# Patient Record
Sex: Female | Born: 1970 | Race: White | Hispanic: No | Smoking: Current every day smoker
Health system: Southern US, Community
[De-identification: ages and names within clinical notes are randomized; demographics above are authoritative.]

## PROBLEM LIST (undated history)

## (undated) DIAGNOSIS — M3390 Dermatopolymyositis, unspecified, organ involvement unspecified: Secondary | ICD-10-CM

## (undated) DIAGNOSIS — F329 Major depressive disorder, single episode, unspecified: Secondary | ICD-10-CM

## (undated) DIAGNOSIS — I10 Essential (primary) hypertension: Secondary | ICD-10-CM

## (undated) DIAGNOSIS — K219 Gastro-esophageal reflux disease without esophagitis: Secondary | ICD-10-CM

## (undated) DIAGNOSIS — R946 Abnormal results of thyroid function studies: Secondary | ICD-10-CM

## (undated) DIAGNOSIS — IMO0001 Reserved for inherently not codable concepts without codable children: Secondary | ICD-10-CM

## (undated) DIAGNOSIS — J9383 Other pneumothorax: Secondary | ICD-10-CM

## (undated) DIAGNOSIS — M797 Fibromyalgia: Secondary | ICD-10-CM

## (undated) DIAGNOSIS — F411 Generalized anxiety disorder: Secondary | ICD-10-CM

## (undated) DIAGNOSIS — F17200 Nicotine dependence, unspecified, uncomplicated: Secondary | ICD-10-CM

## (undated) DIAGNOSIS — R7309 Other abnormal glucose: Secondary | ICD-10-CM

## (undated) DIAGNOSIS — J45909 Unspecified asthma, uncomplicated: Secondary | ICD-10-CM

## (undated) DIAGNOSIS — M3313 Other dermatomyositis without myopathy: Secondary | ICD-10-CM

## (undated) DIAGNOSIS — J329 Chronic sinusitis, unspecified: Secondary | ICD-10-CM

## (undated) DIAGNOSIS — J45901 Unspecified asthma with (acute) exacerbation: Secondary | ICD-10-CM

## (undated) DIAGNOSIS — J309 Allergic rhinitis, unspecified: Secondary | ICD-10-CM

## (undated) HISTORY — DX: Nicotine dependence, unspecified, uncomplicated: F17.200

## (undated) HISTORY — PX: COLONOSCOPY: SHX174

## (undated) HISTORY — DX: Unspecified asthma, uncomplicated: J45.909

## (undated) HISTORY — DX: Abnormal results of thyroid function studies: R94.6

## (undated) HISTORY — DX: Reserved for inherently not codable concepts without codable children: IMO0001

## (undated) HISTORY — DX: Other abnormal glucose: R73.09

## (undated) HISTORY — DX: Major depressive disorder, single episode, unspecified: F32.9

## (undated) HISTORY — DX: Allergic rhinitis, unspecified: J30.9

## (undated) HISTORY — DX: Generalized anxiety disorder: F41.1

## (undated) HISTORY — DX: Unspecified asthma with (acute) exacerbation: J45.901

## (undated) HISTORY — PX: LUNG SURGERY: SHX703

## (undated) HISTORY — DX: Essential (primary) hypertension: I10

## (undated) HISTORY — PX: CERVICAL CONIZATION W/BX: SHX1330

## (undated) HISTORY — PX: KNEE SURGERY: SHX244

## (undated) HISTORY — DX: Gastro-esophageal reflux disease without esophagitis: K21.9

---

## 1997-08-02 ENCOUNTER — Other Ambulatory Visit: Admission: RE | Admit: 1997-08-02 | Discharge: 1997-08-02 | Payer: Self-pay | Admitting: *Deleted

## 1997-11-21 ENCOUNTER — Other Ambulatory Visit: Admission: RE | Admit: 1997-11-21 | Discharge: 1997-11-21 | Payer: Self-pay | Admitting: *Deleted

## 1997-12-24 ENCOUNTER — Other Ambulatory Visit: Admission: RE | Admit: 1997-12-24 | Discharge: 1997-12-24 | Payer: Self-pay | Admitting: *Deleted

## 1998-02-12 ENCOUNTER — Other Ambulatory Visit: Admission: RE | Admit: 1998-02-12 | Discharge: 1998-02-12 | Payer: Self-pay | Admitting: *Deleted

## 1998-12-20 ENCOUNTER — Emergency Department (HOSPITAL_COMMUNITY): Admission: EM | Admit: 1998-12-20 | Discharge: 1998-12-20 | Payer: Self-pay | Admitting: Emergency Medicine

## 1998-12-23 ENCOUNTER — Emergency Department (HOSPITAL_COMMUNITY): Admission: EM | Admit: 1998-12-23 | Discharge: 1998-12-23 | Payer: Self-pay | Admitting: Emergency Medicine

## 1999-04-21 ENCOUNTER — Emergency Department (HOSPITAL_COMMUNITY): Admission: EM | Admit: 1999-04-21 | Discharge: 1999-04-21 | Payer: Self-pay | Admitting: Internal Medicine

## 1999-04-30 ENCOUNTER — Emergency Department (HOSPITAL_COMMUNITY): Admission: EM | Admit: 1999-04-30 | Discharge: 1999-04-30 | Payer: Self-pay | Admitting: Emergency Medicine

## 1999-07-09 ENCOUNTER — Other Ambulatory Visit: Admission: RE | Admit: 1999-07-09 | Discharge: 1999-07-09 | Payer: Self-pay | Admitting: *Deleted

## 1999-12-02 ENCOUNTER — Inpatient Hospital Stay (HOSPITAL_COMMUNITY): Admission: AD | Admit: 1999-12-02 | Discharge: 1999-12-02 | Payer: Self-pay | Admitting: Obstetrics

## 1999-12-03 ENCOUNTER — Inpatient Hospital Stay (HOSPITAL_COMMUNITY): Admission: AD | Admit: 1999-12-03 | Discharge: 1999-12-03 | Payer: Self-pay | Admitting: Obstetrics & Gynecology

## 1999-12-03 ENCOUNTER — Encounter (INDEPENDENT_AMBULATORY_CARE_PROVIDER_SITE_OTHER): Payer: Self-pay

## 1999-12-04 ENCOUNTER — Encounter: Payer: Self-pay | Admitting: *Deleted

## 1999-12-04 ENCOUNTER — Observation Stay (HOSPITAL_COMMUNITY): Admission: AD | Admit: 1999-12-04 | Discharge: 1999-12-05 | Payer: Self-pay | Admitting: *Deleted

## 1999-12-05 ENCOUNTER — Inpatient Hospital Stay (HOSPITAL_COMMUNITY): Admission: AD | Admit: 1999-12-05 | Discharge: 1999-12-05 | Payer: Self-pay | Admitting: *Deleted

## 1999-12-08 ENCOUNTER — Inpatient Hospital Stay (HOSPITAL_COMMUNITY): Admission: AD | Admit: 1999-12-08 | Discharge: 1999-12-08 | Payer: Self-pay | Admitting: *Deleted

## 1999-12-11 ENCOUNTER — Inpatient Hospital Stay (HOSPITAL_COMMUNITY): Admission: AD | Admit: 1999-12-11 | Discharge: 1999-12-11 | Payer: Self-pay | Admitting: Obstetrics

## 1999-12-18 ENCOUNTER — Inpatient Hospital Stay (HOSPITAL_COMMUNITY): Admission: AD | Admit: 1999-12-18 | Discharge: 1999-12-18 | Payer: Self-pay | Admitting: *Deleted

## 1999-12-21 ENCOUNTER — Encounter: Payer: Self-pay | Admitting: Obstetrics & Gynecology

## 1999-12-21 ENCOUNTER — Inpatient Hospital Stay (HOSPITAL_COMMUNITY): Admission: AD | Admit: 1999-12-21 | Discharge: 1999-12-21 | Payer: Self-pay | Admitting: Obstetrics

## 1999-12-25 ENCOUNTER — Inpatient Hospital Stay (HOSPITAL_COMMUNITY): Admission: AD | Admit: 1999-12-25 | Discharge: 1999-12-25 | Payer: Self-pay | Admitting: *Deleted

## 2000-01-01 ENCOUNTER — Inpatient Hospital Stay (HOSPITAL_COMMUNITY): Admission: AD | Admit: 2000-01-01 | Discharge: 2000-01-01 | Payer: Self-pay | Admitting: *Deleted

## 2000-01-13 ENCOUNTER — Inpatient Hospital Stay (HOSPITAL_COMMUNITY): Admission: AD | Admit: 2000-01-13 | Discharge: 2000-01-13 | Payer: Self-pay | Admitting: Obstetrics

## 2000-08-15 ENCOUNTER — Other Ambulatory Visit: Admission: RE | Admit: 2000-08-15 | Discharge: 2000-08-15 | Payer: Self-pay | Admitting: Family Medicine

## 2000-08-17 ENCOUNTER — Encounter: Payer: Self-pay | Admitting: Family Medicine

## 2000-08-17 ENCOUNTER — Encounter: Admission: RE | Admit: 2000-08-17 | Discharge: 2000-08-17 | Payer: Self-pay | Admitting: Family Medicine

## 2001-06-23 ENCOUNTER — Inpatient Hospital Stay (HOSPITAL_COMMUNITY): Admission: EM | Admit: 2001-06-23 | Discharge: 2001-06-27 | Payer: Self-pay | Admitting: *Deleted

## 2001-06-24 ENCOUNTER — Encounter: Payer: Self-pay | Admitting: Thoracic Surgery (Cardiothoracic Vascular Surgery)

## 2001-06-24 ENCOUNTER — Encounter: Payer: Self-pay | Admitting: Internal Medicine

## 2001-06-25 ENCOUNTER — Encounter: Payer: Self-pay | Admitting: Thoracic Surgery (Cardiothoracic Vascular Surgery)

## 2001-06-26 ENCOUNTER — Encounter: Payer: Self-pay | Admitting: Cardiothoracic Surgery

## 2001-06-26 ENCOUNTER — Encounter: Payer: Self-pay | Admitting: Thoracic Surgery (Cardiothoracic Vascular Surgery)

## 2001-06-27 ENCOUNTER — Encounter: Payer: Self-pay | Admitting: Thoracic Surgery (Cardiothoracic Vascular Surgery)

## 2001-07-03 ENCOUNTER — Encounter
Admission: RE | Admit: 2001-07-03 | Discharge: 2001-07-03 | Payer: Self-pay | Admitting: Thoracic Surgery (Cardiothoracic Vascular Surgery)

## 2001-07-03 ENCOUNTER — Encounter: Payer: Self-pay | Admitting: Thoracic Surgery (Cardiothoracic Vascular Surgery)

## 2001-08-10 ENCOUNTER — Encounter: Payer: Self-pay | Admitting: Thoracic Surgery (Cardiothoracic Vascular Surgery)

## 2001-08-10 ENCOUNTER — Encounter: Payer: Self-pay | Admitting: Emergency Medicine

## 2001-08-10 ENCOUNTER — Inpatient Hospital Stay (HOSPITAL_COMMUNITY)
Admission: EM | Admit: 2001-08-10 | Discharge: 2001-08-14 | Payer: Self-pay | Admitting: Thoracic Surgery (Cardiothoracic Vascular Surgery)

## 2001-08-10 ENCOUNTER — Encounter (INDEPENDENT_AMBULATORY_CARE_PROVIDER_SITE_OTHER): Payer: Self-pay | Admitting: *Deleted

## 2001-08-11 ENCOUNTER — Encounter: Payer: Self-pay | Admitting: Thoracic Surgery (Cardiothoracic Vascular Surgery)

## 2001-08-12 ENCOUNTER — Encounter: Payer: Self-pay | Admitting: General Surgery

## 2001-08-13 ENCOUNTER — Encounter: Payer: Self-pay | Admitting: Thoracic Surgery (Cardiothoracic Vascular Surgery)

## 2001-08-14 ENCOUNTER — Encounter: Payer: Self-pay | Admitting: Thoracic Surgery (Cardiothoracic Vascular Surgery)

## 2001-09-11 ENCOUNTER — Encounter
Admission: RE | Admit: 2001-09-11 | Discharge: 2001-09-11 | Payer: Self-pay | Admitting: Thoracic Surgery (Cardiothoracic Vascular Surgery)

## 2001-09-11 ENCOUNTER — Encounter: Payer: Self-pay | Admitting: Thoracic Surgery (Cardiothoracic Vascular Surgery)

## 2003-03-30 HISTORY — PX: OTHER SURGICAL HISTORY: SHX169

## 2003-04-30 ENCOUNTER — Other Ambulatory Visit: Admission: RE | Admit: 2003-04-30 | Discharge: 2003-04-30 | Payer: Self-pay | Admitting: Family Medicine

## 2003-05-30 ENCOUNTER — Other Ambulatory Visit (HOSPITAL_COMMUNITY): Admission: RE | Admit: 2003-05-30 | Discharge: 2003-05-30 | Payer: Self-pay | Admitting: Psychiatry

## 2004-06-17 ENCOUNTER — Ambulatory Visit: Payer: Self-pay | Admitting: Family Medicine

## 2004-11-19 ENCOUNTER — Other Ambulatory Visit: Admission: RE | Admit: 2004-11-19 | Discharge: 2004-11-19 | Payer: Self-pay | Admitting: Family Medicine

## 2004-11-19 ENCOUNTER — Ambulatory Visit: Payer: Self-pay | Admitting: Family Medicine

## 2004-12-10 ENCOUNTER — Encounter: Admission: RE | Admit: 2004-12-10 | Discharge: 2004-12-10 | Payer: Self-pay | Admitting: Family Medicine

## 2005-08-17 ENCOUNTER — Ambulatory Visit: Payer: Self-pay | Admitting: Family Medicine

## 2005-12-03 ENCOUNTER — Ambulatory Visit: Payer: Self-pay | Admitting: Family Medicine

## 2006-02-10 ENCOUNTER — Ambulatory Visit: Payer: Self-pay | Admitting: Family Medicine

## 2006-08-30 ENCOUNTER — Encounter: Payer: Self-pay | Admitting: *Deleted

## 2006-08-31 ENCOUNTER — Observation Stay (HOSPITAL_COMMUNITY): Admission: AD | Admit: 2006-08-31 | Discharge: 2006-08-31 | Payer: Self-pay | Admitting: Obstetrics & Gynecology

## 2006-08-31 ENCOUNTER — Encounter (INDEPENDENT_AMBULATORY_CARE_PROVIDER_SITE_OTHER): Payer: Self-pay | Admitting: Obstetrics & Gynecology

## 2007-01-23 ENCOUNTER — Telehealth: Payer: Self-pay | Admitting: Family Medicine

## 2007-06-26 ENCOUNTER — Ambulatory Visit: Payer: Self-pay | Admitting: Family Medicine

## 2007-06-26 DIAGNOSIS — J309 Allergic rhinitis, unspecified: Secondary | ICD-10-CM | POA: Insufficient documentation

## 2007-06-26 DIAGNOSIS — J45909 Unspecified asthma, uncomplicated: Secondary | ICD-10-CM

## 2007-06-26 DIAGNOSIS — F17200 Nicotine dependence, unspecified, uncomplicated: Secondary | ICD-10-CM

## 2007-06-26 HISTORY — DX: Nicotine dependence, unspecified, uncomplicated: F17.200

## 2007-06-26 HISTORY — DX: Unspecified asthma, uncomplicated: J45.909

## 2007-06-26 HISTORY — DX: Allergic rhinitis, unspecified: J30.9

## 2007-07-26 ENCOUNTER — Ambulatory Visit: Payer: Self-pay | Admitting: Internal Medicine

## 2007-07-26 DIAGNOSIS — J45901 Unspecified asthma with (acute) exacerbation: Secondary | ICD-10-CM

## 2007-07-26 HISTORY — DX: Unspecified asthma with (acute) exacerbation: J45.901

## 2007-07-31 ENCOUNTER — Ambulatory Visit: Payer: Self-pay | Admitting: Family Medicine

## 2007-07-31 LAB — CONVERTED CEMR LAB
Bilirubin Urine: NEGATIVE
Glucose, Urine, Semiquant: NEGATIVE
Specific Gravity, Urine: 1.02
Urobilinogen, UA: 0.2
pH: 6

## 2007-08-01 LAB — CONVERTED CEMR LAB
Albumin: 4.3 g/dL (ref 3.5–5.2)
BUN: 11 mg/dL (ref 6–23)
Calcium: 9.6 mg/dL (ref 8.4–10.5)
Eosinophils Relative: 0.1 % (ref 0.0–5.0)
GFR calc Af Amer: 144 mL/min
Glucose, Bld: 114 mg/dL — ABNORMAL HIGH (ref 70–99)
HCT: 40.2 % (ref 36.0–46.0)
Hemoglobin: 13.7 g/dL (ref 12.0–15.0)
MCV: 100.3 fL — ABNORMAL HIGH (ref 78.0–100.0)
Monocytes Absolute: 0.1 10*3/uL (ref 0.1–1.0)
Monocytes Relative: 1.6 % — ABNORMAL LOW (ref 3.0–12.0)
Neutro Abs: 6.2 10*3/uL (ref 1.4–7.7)
RDW: 11.7 % (ref 11.5–14.6)
TSH: 0.25 microintl units/mL — ABNORMAL LOW (ref 0.35–5.50)
Total CHOL/HDL Ratio: 3.5
Total Protein: 7.5 g/dL (ref 6.0–8.3)
Triglycerides: 87 mg/dL (ref 0–149)
WBC: 7.5 10*3/uL (ref 4.5–10.5)

## 2007-08-11 ENCOUNTER — Telehealth: Payer: Self-pay | Admitting: Family Medicine

## 2007-08-22 ENCOUNTER — Ambulatory Visit: Payer: Self-pay | Admitting: Family Medicine

## 2007-08-22 DIAGNOSIS — R7309 Other abnormal glucose: Secondary | ICD-10-CM

## 2007-08-22 DIAGNOSIS — F3289 Other specified depressive episodes: Secondary | ICD-10-CM

## 2007-08-22 DIAGNOSIS — F329 Major depressive disorder, single episode, unspecified: Secondary | ICD-10-CM

## 2007-08-22 DIAGNOSIS — F411 Generalized anxiety disorder: Secondary | ICD-10-CM

## 2007-08-22 DIAGNOSIS — R946 Abnormal results of thyroid function studies: Secondary | ICD-10-CM

## 2007-08-22 HISTORY — DX: Major depressive disorder, single episode, unspecified: F32.9

## 2007-08-22 HISTORY — DX: Other specified depressive episodes: F32.89

## 2007-08-22 HISTORY — DX: Generalized anxiety disorder: F41.1

## 2007-08-22 HISTORY — DX: Other abnormal glucose: R73.09

## 2007-08-22 HISTORY — DX: Abnormal results of thyroid function studies: R94.6

## 2007-08-23 LAB — CONVERTED CEMR LAB
Free T4: 0.9 ng/dL (ref 0.6–1.6)
Hgb A1c MFr Bld: 5.5 % (ref 4.6–6.0)

## 2007-09-20 ENCOUNTER — Ambulatory Visit: Payer: Self-pay | Admitting: Family Medicine

## 2007-09-20 DIAGNOSIS — K5289 Other specified noninfective gastroenteritis and colitis: Secondary | ICD-10-CM

## 2007-12-20 ENCOUNTER — Telehealth: Payer: Self-pay | Admitting: Family Medicine

## 2008-07-02 ENCOUNTER — Telehealth: Payer: Self-pay | Admitting: Family Medicine

## 2009-04-25 ENCOUNTER — Telehealth: Payer: Self-pay | Admitting: Family Medicine

## 2009-12-17 ENCOUNTER — Emergency Department (HOSPITAL_COMMUNITY): Admission: EM | Admit: 2009-12-17 | Discharge: 2009-12-17 | Payer: Self-pay | Admitting: Emergency Medicine

## 2009-12-30 ENCOUNTER — Telehealth: Payer: Self-pay | Admitting: Family Medicine

## 2010-04-02 ENCOUNTER — Encounter: Payer: Self-pay | Admitting: Family Medicine

## 2010-04-15 ENCOUNTER — Encounter: Payer: Self-pay | Admitting: Family Medicine

## 2010-04-15 ENCOUNTER — Ambulatory Visit
Admission: RE | Admit: 2010-04-15 | Discharge: 2010-04-15 | Payer: Self-pay | Source: Home / Self Care | Attending: Family Medicine | Admitting: Family Medicine

## 2010-04-15 DIAGNOSIS — IMO0001 Reserved for inherently not codable concepts without codable children: Secondary | ICD-10-CM

## 2010-04-15 DIAGNOSIS — F419 Anxiety disorder, unspecified: Secondary | ICD-10-CM

## 2010-04-15 DIAGNOSIS — M791 Myalgia, unspecified site: Secondary | ICD-10-CM

## 2010-04-15 HISTORY — DX: Reserved for inherently not codable concepts without codable children: IMO0001

## 2010-04-15 NOTE — Progress Notes (Unsigned)
This is a 40 yr old female to re-establish with [place] after an absence of several years. She had been living in [place] until she lost her job. Now she is back in the [place] area. Her main complaints,  which started about 2 months ago, include generalized fatigue and  muscle aches including the shoulders and hips and knees and hands and feet. Not much joint swelling. No rashes or fevers or vision changes. She was seen at the [facility] on 04-02-10 and had a lot of labs drawn. These included normal CBC, metabolic panel, UA, TSH, ESR, and CRP. ANA was "positive" but no titer was reported. Negative for Parvovirus and Lyme titers. She was put on Meloxicam 15 mg a day, and this has helped a little. She also needs some routine med refills. Her anxiety is stable, as is her asthma.  ROS negative other than the above. EXAM HEENT clear, neck supple with no masses or tenderness, lungs clear, cardiac RRR without GMR, skin is clear. Extremities show no edema or synovial swelling. No tenderness.

## 2010-04-15 NOTE — Progress Notes (Deleted)
Subjective:     Patient ID: [name] [name] is a 40 y.o. female.  HPI  {Common ambulatory SmartLinks:19316}  Review of Systems     Objective:   Physical Exam     Assessment:     ***    Plan:     ***

## 2010-04-15 NOTE — Progress Notes (Unsigned)
Subjective:     Patient ID: [name] [name] is a 39 y.o. female.  HPI  {Common ambulatory SmartLinks:19316}  Review of Systems     Objective:   Physical Exam     Assessment:     ***    Plan:     ***      

## 2010-04-20 ENCOUNTER — Encounter: Payer: Self-pay | Admitting: Family Medicine

## 2010-04-20 ENCOUNTER — Telehealth: Payer: Self-pay | Admitting: Family Medicine

## 2010-04-23 ENCOUNTER — Encounter: Payer: Self-pay | Admitting: Family Medicine

## 2010-04-24 ENCOUNTER — Telehealth: Payer: Self-pay | Admitting: Family Medicine

## 2010-04-28 NOTE — Progress Notes (Signed)
Summary: Pt wants to know date and location of last mammogram  Phone Note Call from Patient Call back at [phone]   Caller: Patient Summary of Call: Pt called and needs to know when her last mammogram was and where?    Initial call taken by: Lucy Antigua,  April 25, 2009 8:56 AM  Follow-up for Phone Call        pt [facility].  Called and l/m on pts cell phone with the date and phone number to call and schedule appt Follow-up by: Alfred Levins, CMA,  April 25, 2009 1:28 PM

## 2010-04-28 NOTE — Progress Notes (Signed)
Summary: Pt moved back to town. Req script for Allbuterol  Phone Note Call from Patient Call back at [phone]   Caller: Patient Summary of Call: Pt called and says that she has moved  back to town. Pts insurance will not be effective until Jan 2012. Pt would like to re-est with Dr. Clent Ridges, but is wondering if he could write script for Albuterol Inhaler 8.5 grams 200 meter Inhalations, to last until she can sch an appt in Jan 2012. [facility] [place] [place]. Pt does not have telephone # for pharmacy. Pt has appt to re-est with Dr. Clent Ridges on 04/15/2010. Pt wants to be notified if this has been called in or not.  Initial call taken by: Lucy Antigua,  December 30, 2009 11:58 AM  Follow-up for Phone Call        call in Proair inhaler, #1 with 2 rf Follow-up by: Nelwyn Salisbury MD,  December 30, 2009 5:01 PM  Additional Follow-up for Phone Call Additional follow up Details #1::        left mess on pt phone rx called in  Additional Follow-up by: Pura Spice, RN,  December 30, 2009 5:14 PM    New/Updated Medications: PROAIR HFA 108 (90 BASE) MCG/ACT  AERS (ALBUTEROL SULFATE) 2 puffs every 4 hours as needed for sob Prescriptions: PROAIR HFA 108 (90 BASE) MCG/ACT  AERS (ALBUTEROL SULFATE) 2 puffs every 4 hours as needed for sob  #1 x 2   Entered by:   Pura Spice, RN   Authorized by:   Nelwyn Salisbury MD   Signed by:   Pura Spice, RN on 12/30/2009   Method used:   [facility] ...       [facility]  [address]  [id number]ail[facility]       [address]       [place], [place]  [address]       Ph: [phone] or [phone]       Fax: [phone]   RxID:   [id number]

## 2010-04-30 NOTE — Progress Notes (Addendum)
Summary: STATUS OF FAXED PAPERWORK  Phone Note Other Incoming   Caller: [employer] Summary of Call: Called to ck on status of faxed paperwork.  Initial call taken by: Debbra Riding,  April 24, 2010 4:31 PM  Follow-up for Phone Call        being sent to medical records for records they are requesting  Follow-up by: Pura Spice, RN,  April 24, 2010 5:17 PM

## 2010-04-30 NOTE — Progress Notes (Signed)
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 Fax to  [phone]   HR Claudette Head Pt. [phone] Initial call taken by: Lynann Beaver CMA AAMA,  April 20, 2010 8:50 AM  Follow-up for Phone Call        please take care of this. The note should cover her until 04-29-10 when she sees her rheumatologist  Follow-up by: Nelwyn Salisbury MD,  April 20, 2010 1:05 PM  Additional Follow-up for Phone Call Additional follow up Details #1::        faxed  pt aware Additional Follow-up by: Pura Spice, RN,  April 20, 2010 1:21 PM     Appended Document: note faxed to employer [phone] is the correct number, and the one more fax to [employer] [phone] [facility] for the first fax. Please fax to both numbers.  Appended Document: note faxed to employer spoke with pt whom said fax number yest as listed incorrect refaxed to new numbers she provided.

## 2010-04-30 NOTE — Letter (Signed)
Summary: [employer]  Work Excuse Letter   Imported By: Maryln Gottron 04/24/2010 15:31:35  _____________________________________________________________________  External Attachment:    Type:   Image     Comment:   External Document

## 2010-04-30 NOTE — Letter (Signed)
Summary: Release of Information  Release of Information   Imported By: Georgian Co 04/23/2010 10:38:34  _____________________________________________________________________  External Attachment:    Type:   Image     Comment:   External Document

## 2010-04-30 NOTE — Assessment & Plan Note (Signed)
Summary: pt to re-est/cjr   Vital Signs:  Patient profile:   40 year old female Weight:      157 pounds O2 Sat:      98 % Temp:     98.6 degrees F Pulse rate:   102 / minute BP sitting:   120 / 80  (left arm) Cuff size:   regular  Vitals Entered By: Pura Spice, RN (April 15, 2010 3:01 PM) CC: to re est  stataes had labs [facility]  did labs  had + ANA   Preventive Screening-Counseling & Management  Alcohol-Tobacco     Smoking Status: current     Packs/Day: 1.0  Allergies: 1)  ! Codeine  Social History: Packs/Day:  1.0   Impression & Recommendations:  Problem # 1:  MYALGIA (ICD-729.1)  Orders: Rheumatology Referral (Rheumatology)  Problem # 2:  ANXIETY (ICD-300.00)  Her updated medication list for this problem includes:    Lorazepam 0.5 Mg Tabs (Lorazepam) .Marland Kitchen..Marland Kitchen Two times a day as needed anxiety  Problem # 3:  ASTHMA (ICD-493.90)  The following medications were removed from the medication list:    Symbicort 160-4.5 Mcg/act Aero (Budesonide-formoterol fumarate) .Marland Kitchen... 2 puffs two times a day Her updated medication list for this problem includes:    Proair Hfa 108 (90 Base) Mcg/act Aers (Albuterol sulfate) .Marland Kitchen... 2 puffs every 4 hours as needed for sob    Prednisone 20 Mg Tabs (Prednisone) ..... Once daily  Complete Medication List: 1)  Lorazepam 0.5 Mg Tabs (Lorazepam) .... Two times a day as needed anxiety 2)  Proair Hfa 108 (90 Base) Mcg/act Aers (Albuterol sulfate) .... 2 puffs every 4 hours as needed for sob 3)  Prednisone 20 Mg Tabs (Prednisone) .... Once daily Prescriptions: PREDNISONE 20 MG TABS (PREDNISONE) once daily  #30 x 2   Entered and Authorized by:   Nelwyn Salisbury MD   Signed by:   Nelwyn Salisbury MD on 04/15/2010   Method used:   Print then Give to Patient   RxID:   [id number] PROAIR HFA 108 (90 BASE) MCG/ACT  AERS (ALBUTEROL SULFATE) 2 puffs every 4 hours as needed for sob  #1 x 5   Entered and Authorized by:   Nelwyn Salisbury MD  Signed by:   Nelwyn Salisbury MD on 04/15/2010   Method used:   Print then Give to Patient   RxID:   [id number] LORAZEPAM 0.5 MG TABS (LORAZEPAM) two times a day as needed anxiety  #60 x 5   Entered and Authorized by:   Nelwyn Salisbury MD   Signed by:   Nelwyn Salisbury MD on 04/15/2010   Method used:   Print then Give to Patient   RxID:   [id number]    Orders Added: 1)  Rheumatology Referral [Rheumatology] 2)  New Patient Level IV [03474]       This is a 40 yr old female to re-establish with [place] after an absence of several years. She had been living in [place] until she lost her job. Now she is back in the [place] area. Her main complaints, which started about 2 months ago, include generalized fatigue and muscle aches including the shoulders and hips and knees and hands and feet. Not much joint swelling. No rashes or fevers or vision changes. She was seen at the [facility] on 04-02-10 and had a lot of labs drawn. These included normal CBC, metabolic panel, UA, TSH, ESR, and CRP. ANA was "  positive" but no titer was reported. Negative for Parvovirus and Lyme titers. She was put on Meloxicam 15 mg a day, and this has helped a little. She also needs some routine med refills. Her anxiety is stable, as is her asthma. ROS negative other than the above.  EXAM HEENT clear, neck supple with no masses or tenderness, lungs clear, cardiac RRR without GMR, skin is clear. Extremities show no edema or synovial swelling. No tenderness.

## 2010-05-06 NOTE — Letter (Signed)
Summary: [facility]   Imported By: Maryln Gottron 04/27/2010 10:43:50  _____________________________________________________________________  External Attachment:    Type:   Image     Comment:   External Document

## 2010-05-07 ENCOUNTER — Telehealth: Payer: Self-pay | Admitting: *Deleted

## 2010-05-07 NOTE — Telephone Encounter (Signed)
Pt is asking to go back on Celexa.

## 2010-05-08 MED ORDER — CITALOPRAM HYDROBROMIDE 20 MG PO TABS: 20.0000 mg | ORAL_TABLET | Freq: Every day | ORAL | Status: DC

## 2010-05-08 NOTE — Telephone Encounter (Signed)
Left mess on her cell phone rx called in and needs to sch ov in 1 month

## 2010-05-08 NOTE — Telephone Encounter (Signed)
Okay, call in Celexa 20 mg qd , #30 with 2 rf. See me in one month

## 2010-06-05 ENCOUNTER — Ambulatory Visit: Payer: Self-pay | Admitting: Family Medicine

## 2010-06-25 ENCOUNTER — Ambulatory Visit: Payer: Self-pay | Admitting: Family Medicine

## 2010-07-06 ENCOUNTER — Ambulatory Visit (INDEPENDENT_AMBULATORY_CARE_PROVIDER_SITE_OTHER): Payer: Managed Care, Other (non HMO) | Admitting: Family Medicine

## 2010-07-06 ENCOUNTER — Encounter: Payer: Self-pay | Admitting: Family Medicine

## 2010-07-06 DIAGNOSIS — F329 Major depressive disorder, single episode, unspecified: Secondary | ICD-10-CM

## 2010-07-06 DIAGNOSIS — J45909 Unspecified asthma, uncomplicated: Secondary | ICD-10-CM

## 2010-07-06 MED ORDER — ALBUTEROL SULFATE HFA 108 (90 BASE) MCG/ACT IN AERS: 2.0000 | INHALATION_SPRAY | RESPIRATORY_TRACT | Status: DC | PRN

## 2010-07-06 MED ORDER — CITALOPRAM HYDROBROMIDE 20 MG PO TABS: 20.0000 mg | ORAL_TABLET | Freq: Every day | ORAL | Status: DC

## 2010-07-06 NOTE — Progress Notes (Signed)
  Subjective:    Patient ID: [name] [name], female    DOB: [birth year], 40 y.o.   MRN: [id number]  HPI Here to follow up on asthma and depression. She is doing well, and her asthma has been stable. Needs refills today. Her moods are stable. Seeing Dr. Dareen Piano for her joint pains. 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    Review of Systems  Constitutional: Negative.   Respiratory: Negative.   Cardiovascular: Negative.   Musculoskeletal: Negative.   Psychiatric/Behavioral: Negative.        Objective:   Physical Exam  Constitutional: She appears well-developed and well-nourished.  Cardiovascular: Normal rate, regular rhythm, normal heart sounds and intact distal pulses.   Pulmonary/Chest: Effort normal and breath sounds normal.  Psychiatric: She has a normal mood and affect.          Assessment & Plan:  Refilled meds as above.

## 2010-08-10 ENCOUNTER — Other Ambulatory Visit: Payer: Self-pay | Admitting: Obstetrics and Gynecology

## 2010-08-11 NOTE — Op Note (Signed)
[name], [name]            ACCOUNT NO.:  [id number]   MEDICAL RECORD NO.:  [id number]            PATIENT TYPE:   LOCATION:                                 FACILITY:   PHYSICIAN:  Ilda Mori, M.D.        DATE OF BIRTH:   DATE OF PROCEDURE:  08/31/2006  DATE OF DISCHARGE:                               OPERATIVE REPORT   PREOPERATIVE DIAGNOSIS:  Right ectopic pregnancy.   POSTOPERATIVE DIAGNOSIS:  Right ectopic pregnancy with hematoperitoneum.   PROCEDURE:  Laparoscopic right salpingectomy.   ANESTHESIA:  General.   ESTIMATED BLOOD LOSS:  Was 500 mL.   SPECIMENS:  Right fallopian tube with ischemic ectopic which was sent to  pathology.   COMPLICATIONS:  None.   INDICATIONS FOR PROCEDURE:  This is a 40 year old, gravida 2, para 0-0-1-  0 with a previous ectopic in 2002, who presented to [facility]  emergency room with 5-6 weeks of amenorrhea and right lower abdominal  pain.  The patient did not know she was pregnant when she presented but  was found to have a HCG of 5000 International units and an ultrasound  which showed a complex right adnexal mass and no intrauterine pregnancy.  The patient was transferred to [facility] where evaluation revealed  the patient was in stable condition.  The hemoglobin was 12.8.  The  vital signs were stable.  Options were discussed with the patient and  the decision was made to proceed with methotrexate.  The patient was  having some pain and the decision was made to repeat the hemoglobin  prior to sending her home and the methotrexate.  The hemoglobin was  found to be 11.3 which was significantly lower than the 12.8 that had  been obtained earlier in the evening.  The patient was still stable at  this time and it was felt that observation was indicated and repeat  hemoglobin to see if this was an artifact or a real decrease.  During  the evening, the patient noticed the acute onset of severe pain and the  decision was made to  proceed to the operating room.   PROCEDURE:  The patient was taken to the operating room and placed under  general endotracheal anesthesia.  The abdomen was prepped and draped in  a sterile fashion.  The patient was placed in the modified dorsal  lithotomy position.  An incision was made at the base of the umbilicus  and a Veress needle introduced into the peritoneal cavity.  Pneumoperitoneum was created.  A 10 mm trocar was then introduced into  the pneumoperitoneum.  Under direct visualization, two 5 mm trocars were  placed in the left side of the abdomen, the first in the left lower  quadrant and the second in the left periumbilical area.  The pelvis was  viewed.  Blood and clots were found to be present and a small isthmic  ectopic pregnancy was noted with active bleeding from the partial  perforation of the tube.  The decision was made to do a salpingectomy  which was carried out using a gyrus tripolar  instrument where the meso  salpinx was cauterized and cut until the entire tube was removed.   The operative site was inspected and hemostasis was noted to be present.  The right ovary appeared normal.  The left adnexa were inspected.  There  were some adhesions from the fimbria to the omentum and these were lysed  sharply, so that at the end of the procedure, the left ovary and tube  appeared to be free.  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   At this point, after the blood and clots were evacuated as well as  possible, and hemostasis was noted to be present, the procedure was  terminated.  The gas was allowed to escape.  The trocars were removed  from the abdomen.  The 5 mm incisions were closed with Dermabond  adhesive and the umbilical incision was closed with a subcuticular 5-0  Dexon suture.   Please note that after the right tube was free, the  laparoscope was  removed from the umbilical port and replaced with an Endobag.  The 5 mm  scope was placed through the left periumbilical port and the specimen  was removed through the umbilical port in the Endobag.  This obviously  was done prior to the subsequent dictation of the closure of the  incisions.   The procedure was then terminated and the patient left the operating  room in good condition.      Ilda Mori, M.D.  Electronically Signed     RK/MEDQ  D:  08/31/2006  T:  08/31/2006  Job:  [id number]

## 2010-08-14 NOTE — H&P (Signed)
[facility]  Patient:    [name] [name], [name] [name] Visit Number: [id number] MRN: [id number]          Service Type: SUR Location: 3300 [id number] Attending Physician:  Tressie Stalker Dictated by:   Adair Patter, P.A. Admit Date:  08/10/2001 Discharge Date: 08/14/2001                           History and Physical  CHIEF COMPLAINT:  Right-sided chest pain.  HISTORY OF PRESENT ILLNESS:  This is a 40 year old white female who ______ Dr. Purcell Nails.  Several weeks ago she had experienced a right spontaneous pneumothorax that was resolved with chest tube.  She states this morning she began to experience muscle tightness in the a.m., followed by some shortness of breath.  Because she had previously had this problem before, she came to [facility], at which time she had a chest x-ray taken which revealed she had a complete right-sided pneumothorax. Emergency room physician placed a chest tube with partial resolution of this pneumothorax.  Dr. Cornelius Moras was notified, who instructed that patient be transferred to [facility] for possible surgical correction.  PAST MEDICAL HISTORY:  Asthma.  PAST SURGICAL HISTORY: 1. Right spontaneous pneumothorax, resolved with chest tube. 2. Cervical ______. 3. Wisdom tooth extraction.  ALLERGIES:  The patient denies any known medication allergies.  MEDICATIONS:  Advair Diskus 250/50 -- she uses this approximately every three days.  SOCIAL HISTORY:  The patient smokes 1 pack of cigarettes per day and has done so for 18 years.  She drinks approximately one 12-pack of beer per week and states she occasionally uses marijuana.  FAMILY HISTORY:  The patient has a grandmother who had coronary artery disease, a stroke and hypertension.  Her mother has diabetes mellitus and hypertension.  She has a sister with diabetes and hypertension.  There is no family history of cancer.  REVIEW OF  SYSTEMS:  GENERAL:  The patient denies any fever, chills, night sweats or frequent illnesses.  HEAD:  The patient denies any head injuries or loss of consciousness.  EYES:  The patient denies any glaucoma or cataracts. EARS:  The patient denies any hearing loss, tinnitus, vertigo or ear infections.  NOSE:  The patient denies any epistaxis, rhinitis or sinusitis. MOUTH:  The patient denies any problems with dentition or frequent sore throats.   NECK:  The patient denies any lumps, masses or pain with range of motion in her neck.  CARDIOVASCULAR:  The patient denies any coronary artery disease, cardiac arrhythmias or hypertension.  PULMONARY:  The patient has asthma, which is treated medically.  She is a history of right spontaneous pneumothorax.  Denies any emphysema, bronchitis or pneumonia.  GI:  The patient denies any nausea, vomiting, diarrhea, constipation, hematochezia or melena.  GU:  The patient denies any urinary incontinence or urinary tract infections.  ENDOCRINE:  The patient denies any thyroid disease or diabetes mellitus.  MUSCULOSKELETAL:  The patient denies any arthritis, arthralgias or myalgias.  NEUROLOGIC:  The patient denies any stroke, transient ischemic attack, memory loss or depression.  PHYSICAL EXAMINATION:  VITAL SIGNS:  The patient is afebrile with stable vital signs.  GENERAL:  The patient is alert and oriented x3 and is in no distress.  HEENT:  Head is atraumatic and normocephalic.  Eyes:  Pupils equal, round and reactive to light and accommodation.  Extraocular motions are intact without scleral icterus or  nystagmus.  Ears:  Auditory acuity is grossly intact. Nose: Nasal patency intact.  Sinuses are nontender.  Mouth is moist without exudates.  NECK:  Supple without JVD, lymphadenopathy, carotid bruits or thyromegaly.  CARDIOVASCULAR:  Regular rate and rhythm without murmurs, gallops or rubs.  LUNGS:  Lungs are bilaterally clear to auscultation without  rales, rhonchi or wheezes.  ABDOMEN:  Soft, nontender and nondistended.  Positive bowel sounds in all four quadrants.  EXTREMITIES:  No cyanosis, clubbing or edema.  NEUROLOGIC:  Cranial nerves II-XII are grossly intact.  No focal neurologic deficits were noted.  She was able to move all four extremities.  RADIOLOGIC DATA:  Chest tube upon admission revealed a large right-sided pneumothorax.  No free air was noted under the diaphragm.  Chest x-ray post chest tube placement revealed partial resolution of the pneumothorax with a small residual right apical pneumothorax; please note that the chest x-ray was taken prior to placing chest tube on suction.  IMPRESSION:  Recurrent spontaneous right pneumothorax.  PLAN: 1. The patient will have chest tube to low continuous suction for resolution    of the right pneumothorax. 2. The patient will be transferred to [facility] under the care of    Dr. Purcell Nails. 3. The patient will undergo possible video-assisted thoracostomy for surgical    correction of her recurrent right spontaneous pneumothorax.  Dictated by: Adair Patter, P.A. Attending Physician:  Tressie Stalker DD:  08/10/01 TD:  08/11/01 Job: [id number]

## 2010-08-14 NOTE — H&P (Signed)
[facility]  Patient:    [name] [name], BATH Visit Number: [id number] MRN: [id number]          Service Type: MED Location: 3W [phone] Attending Physician:  Tressie Stalker Dictated by:   Salvatore Decent. Cornelius Moras, M.D. Admit Date:  06/23/2001   CC:         Jeannett Senior A. Clent Ridges, M.D.  CV[facility] Office   History and Physical  HISTORY OF PRESENT ILLNESS:  The patient is a 40 year old white female with a history of asthma and tobacco use, who developed sudden onset of right-sided chest discomfort associated with shortness of breath yesterday evening.  Her symptoms worsened over the course of the night, prompting her to present to the emergency room this morning.  A portable chest x-ray performed here in the emergency department at [facility] documents the presence of a large right hemothorax.  The patient has no previous history of prior spontaneous pneumothorax.  PAST MEDICAL HISTORY:  Notable for asthma and tobacco use.  PAST SURGICAL HISTORY:  Notable for laparoscopy for ectopic pregnancy, cervical cone biopsy, and knee surgery in the remote past.  FAMILY HISTORY:  Noncontributory.  CURRENT MEDICATIONS:  Advair Diskus, Claritin D, and Proventil inhaler as needed for asthma flare.  ALLERGIES:  The patient denies any known drug allergies or sensitivities.  REVIEW OF SYSTEMS:  Notable for pleuritic chest pain located to the right side with associated resting shortness of breath.  The patient denies productive cough, fever, and chills.  The patient has had no recent trauma.  She reports good appetite prior to this event.  The remainder of her review of systems is unremarkable.  PHYSICAL EXAMINATION:  Notable for a well-developed, well-nourished, white female who is somewhat anxious and short of breath.  VITAL SIGNS:  She is in normal sinus rhythm and normotensive and afebrile.  SKIN:  Clean, dry, and healthy-appearing throughout.  NECK:  Supple.  There  is no jugular venous distention.  The trachea is midline.  CHEST:  Auscultation documents the absence of breath sounds on the right side. Breath sounds are clear on the left.  No wheezes or rhonchi are noted.  CARDIOVASCULAR:  Regular rate and rhythm.  No murmurs, rubs, or gallops noted.  ABDOMEN:  Flat, soft, and nontender.  EXTREMITIES:  Warm and well perfused.  There is no lower extremity edema.  NEUROLOGIC:  Nonfocal examination.  The remainder of her physical exam is noncontributory.  DIAGNOSTIC TESTS:  A portable chest x-ray performed in the emergency department documents a very large right pneumothorax with essentially almost complete collapse of the right lung.  No other abnormalities are noted.  IMPRESSION:  Right spontaneous pneumothorax.  PLAN:  Chest tube placement followed by admission to the hospital for further observation and management. Dictated by:   Salvatore Decent Cornelius Moras, M.D. Attending Physician:  Tressie Stalker DD:  06/23/01 TD:  06/23/01 Job: [id number]

## 2010-08-14 NOTE — Op Note (Signed)
[place]. [facility]  Patient:    [name] [name], [name] [name] Visit Number: [id number] MRN: [id number]          Service Type: SUR Location: [id number] Attending Physician:  Tressie Stalker Dictated by:   Salvatore Decent. Cornelius Moras, M.D. Proc. Date: 08/11/01 Admit Date:  08/10/2001 Discharge Date: 08/14/2001                             Operative Report  PREOPERATIVE DIAGNOSIS:  Recurrent right spontaneous pneumothorax.  POSTOPERATIVE DIAGNOSIS:  Recurrent right spontaneous pneumothorax.  OPERATION PERFORMED:  Right video assisted thoracoscopic surgery for apical bleb resection, apical pleurectomy and mechanical pleurodesis.  SURGEON:  Salvatore Decent. Cornelius Moras, M.D.  ASSISTANT:  Adair Patter, P.A.  ANESTHESIA:  General.  INDICATIONS FOR PROCEDURE:  The patient is a 40 year old white female with a history of right spontaneous pneumothorax in March of 2003 treated with chest tube drainage.  The patient now presents with recurrent right spontaneous pneumothorax requiring chest tube placement for near tension pneumothorax on Aug 10, 2001.  The patient has been counseled at length regarding the indications and potential benefits for surgical intervention for definitive treatment and prevention of future recurrent pneumothorax.  Alternative strategies have been discussed.  She understands and accepts all associated risks of surgery including but not limited to risks of death, myocardial infarction, pneumonia, bleeding requiring blood transfusion, prolonged air leak requiring prolonged chest tube drainage, prolonged chest wall pain.  All of her questions have been addressed.  DESCRIPTION OF PROCEDURE:  The patient was brought to the operating room on the above-mentioned date and placed in the supine position on the operating table.  A radial arterial line was placed and central venous catheter was placed by the anesthesia service under the care and direction of Dr. Darlyn Read.   Intravenous antibiotics antibiotics are administered.  Following induction with general endotracheal anesthesia using a dual lumen endotracheal tube, a Foley catheter was placed.  Pneumatic sequential compression boots were placed on both lower extremities to prevent deep vein thrombosis.  The patient was turned to the left lateral decubitus position and an axillary roll and pneumatic bean bag device were utilized to facilitate positioning.  The patients right chest was prepped and draped in a sterile manner after removal of the existing chest tube.  The right lung was allowed to collapse and single lung anesthesia was begun.  A small incision was made overlying the sixth intercostal space in the anterior axillary line.  The incision was completed through the subcutaneous tissues and intercostal musculature using electrocautery.  The right chest was entered carefully.  A 10 mm port was passed through the incision and a 30 degree endoscopic video camera was passed through the port.  The right chest was explored visually.  The right lung was completely collapsed.  There were no adhesions.  An additional surgical incision was placed posteriorly through the fifth intercostal space.  The previous chest tube incision located more anteriorly was utilized as a third access incision.  Gentle retraction was utilized to mobilize the apex of the right lung.  There are several areas along the surface of the apex suggestive of apical blebs.  Apical bleb resection was performed using an endoscopic stapling device with Gore-Tex strips to buttress the staple lines.  Two small apical wedge resections were performed corresponding to what appeared to be visible blebs at the apex.  An additional bleb was identified along the major fissure  line anteriorly in the right middle lobe.  This was resected with a single fire of the stapler.  The apex of the superior segment of the right lower lobe was carefully  inspected and appears normal without blebs.  The lung was carefully inspected and no other sites of blebs were visualized.  Apical pleurectomy was performed.  The parietal pleura was gently removed with retraction circumferentially across the apex of the right hemithorax.  Mechanical pleurodesis was then performed using a cautery scratch pad to abrade the remaining parietal pleura circumferentially as well as along the dome of the diaphragm.  The right chest was filled with warm saline solution.  Careful visual inspection was performed while the lungs briefly reinflated holding manual pressure with 25 cm water airway pressure and inspected for any residual air leaks.  No air leaks were identified.  The  right chest was drained using a single 28 French right angle chest tube placed through the middle port incision.  The chest tube was secured to the skin in routine fashion.  The two remaining port incisions were closed in multiple layers in routine fashion.  The chest tube was fixed to a closed suction drainage device.  The patient was turned back to the supine position, extubated in the operating room, and transported to the recovery room in stable condition.  There were no intraoperative complications. Sponge, needle and instrument counts were verified correct at the completion of the operation.  Estimated blood loss for this procedure was less than 50 cc. Dictated by:   Salvatore Decent Cornelius Moras, M.D. Attending Physician:  Tressie Stalker DD:  08/11/01 TD:  08/14/01 Job: [id number]

## 2010-08-14 NOTE — Op Note (Signed)
[place]. [facility]  Patient:    [name] [name], [name] [name] Visit Number: [id number] MRN: [id number]          Service Type: MED Location: 3W [phone] [id number] Attending Physician:  Tressie Stalker Dictated by:   Salvatore Decent. Cornelius Moras, M.D. Proc. Date: 06/23/01 Admit Date:  06/23/2001                             Operative Report  PREOPERATIVE DIAGNOSIS:  Right spontaneous pneumothorax.  POSTOPERATIVE DIAGNOSIS:  Right spontaneous pneumothorax.  PROCEDURE:  Right tube thoracostomy (chest tube placement).  SURGEON:  Salvatore Decent. Cornelius Moras, M.D.  ANESTHESIA:  2% lidocaine local with intravenous sedation.  BRIEF PROCEDURE NOTE:  With the patient in the emergency department using continuous telemetry monitoring and continuous pulse oximetry, intravenous sedation was administered consisting of a total of 6 mg of morphine and 2 mg of midazolam all given intravenously.  The patient was placed in the supine position, rolled towards her left side with her right arm extended above her left shoulder.  The right anterior lateral chest was prepared with Betadine solution and draped in the sterile manner.  Lidocaine solution 2% was utilized to anesthetize locally the skin and subcutaneous tissues overlying approximately the 5th intercostal space in the anterior axillary line.  A small longitudinal incision was made.  A blunt Kelly clamp was utilized to penetrate through the intercostal space into the right pleural space.  A sudden large rush of air is appreciated.  A #28 French straight chest tube was passed through the incision and positioned uneventfully into the right pleural space.  The chest tube was secured to the skin with silk sutures.  The chest tube was fixed to a closed suction drainage device.  The patient tolerated the procedure well and remained in the emergency department in stable condition after the completion of the procedure. Dictated by:   Salvatore Decent Cornelius Moras, M.D. Attending  Physician:  Tressie Stalker DD:  06/23/01 TD:  06/24/01 Job: [id number]

## 2010-08-14 NOTE — Discharge Summary (Signed)
[place]. [facility]  Patient:    [name] [name], [name] [name] Visit Number: [id number] MRN: [id number]          Service Type: SUR Location: [id number] Attending Physician:  Tressie Stalker Dictated by:   Adair Patter, P.A. Admit Date:  08/10/2001 Discharge Date: 08/14/2001                             Discharge Summary  ADMISSION DIAGNOSIS:  Spontaneous right-sided pneumothorax.  DISCHARGE DIAGNOSIS:  Spontaneous right-sided pneumothorax.  HOSPITAL COURSE:  Ms. Wieland was admitted to [facility] emergency room on Aug 10, 2001, after experiencing right-sided chest pain.  Chest x-ray at this time revealed she had a right tension pneumothorax.  After having a chest tube placed by the emergency room physician, the patient was transferred to [facility] under the care of [facility]. Cornelius Moras, M.D.  Because this is a recurrent right spontaneous pneumothorax, Dr. Cornelius Moras felt surgical correction was necessary.  On Aug 11, 2001, the patient underwent a right video-assisted thoracoscopy for apical bleb resection, pleurectomy, and mechanical pleurodesis.  No complications were noted during the procedure. Postoperatively the patient had an uneventful hospital course.  Adequate pain control was achieved, and her chest tube was discontinued as deemed appropriate.  The patient was deemed stable for discharge on Aug 14, 2001.  DISCHARGE MEDICATIONS: 1. Tylox one to two tablets every four to six hours as needed for pain. 2. Advair Diskus 250/50, use as needed.  The patient was instructed she could resume her Clarinex and birth control pills as before.  DISCHARGE INSTRUCTIONS:  Activity:  The patient was instructed to avoid driving, strenuous activity, lifting heavy objects.  She was told to walk daily and continue her breathing exercises.  Discharge diet:  Regular.  Wound care: The patient was told she could shower and clean her incision with soap and  water.  DISCHARGE DISPOSITION:  Home.  FOLLOW-UP:  The patient is to see Dr. Cornelius Moras in approximately one week.  She is told to have a chest x-ray taken one hour before this appointment at the [facility]. Dictated by:   Adair Patter, P.A. Attending Physician:  Tressie Stalker DD:  08/13/01 TD:  08/15/01 Job: [id number]

## 2010-08-14 NOTE — Discharge Summary (Signed)
[facility]  Patient:    CECILIE, [name] [name] Visit Number: [id number] MRN: [id number]          Service Type: MED Location: 3W [phone] Attending Physician:  Tressie Stalker Dictated by:   Adair Patter, P.A.-C Admit Date:  06/23/2001 Disc. Date: 06/27/01   CC:         CVTS office   Discharge Summary  DATE OF BIRTH:  01-11-2071  ADMISSION DIAGNOSIS:  Right spontaneous pneumothorax.  DISCHARGE DIAGNOSIS:  Right spontaneous pneumothorax.  HOSPITAL COURSE:  [name] was admitted to [facility] on 06/23/01, after experiencing right-sided chest pain and shortness of breath.  Because of this, the patient had a chest x-ray taken which revealed she had a right spontaneous pneumothorax.  Because of this, Dr. Cornelius Moras inserted a right-sided chest tube.  Her pneumothorax was followed with serial chest x-rays.  Her chest tube was placed to water seal when her lung was deemed to be fully expanded with no air leak in the chest tube.  Serial chest x-rays were further followed, and the patients chest tube was discontinued.  Followup chest x-ray after chest tube removed revealed a less than 5% pneumothorax.  The patient was kept in the hospital for another 24 hours, and a repeat chest x-ray was obtained which revealed that the right-sided less than 5% apical pneumothorax was stable in appearance.  Because of this, she was discharged home in stable satisfactory condition.  DISCHARGE MEDICATIONS: 1. Tylox one or two tablets q.4-6h. p.r.n. pain. 2. The patient was also told she could resume her home medications.  ACTIVITY:  The patient is told to avoid driving while taking Tylox.  DIET:  Regular.  WOUND CARE:  The patient was told she can shower, and clean her chest tube insertion sites with regular soap and water.  She has a stitch remaining which will be taken out in the office.  DISPOSITION:  Home.  FOLLOWUP:  The patient will see Dr. Tressie Stalker on  Monday, July 03, 2001, at 9:45 a.m. at the CVTS office.  She is told to go to the [facility] for a chest x-ray one hour before this appointment. Dictated by:   Adair Patter, P.A.-C Attending Physician:  Tressie Stalker DD:  06/27/01 TD:  06/27/01 Job: [id number]

## 2010-08-17 ENCOUNTER — Other Ambulatory Visit: Payer: Self-pay | Admitting: Obstetrics and Gynecology

## 2011-01-11 ENCOUNTER — Encounter: Payer: Self-pay | Admitting: Family Medicine

## 2011-01-11 ENCOUNTER — Ambulatory Visit (INDEPENDENT_AMBULATORY_CARE_PROVIDER_SITE_OTHER): Payer: Managed Care, Other (non HMO) | Admitting: Family Medicine

## 2011-01-11 VITALS — BP 128/90 | HR 91 | Temp 98.4°F | Wt 161.0 lb

## 2011-01-11 DIAGNOSIS — M3390 Dermatopolymyositis, unspecified, organ involvement unspecified: Secondary | ICD-10-CM

## 2011-01-11 DIAGNOSIS — K219 Gastro-esophageal reflux disease without esophagitis: Secondary | ICD-10-CM

## 2011-01-11 DIAGNOSIS — J45909 Unspecified asthma, uncomplicated: Secondary | ICD-10-CM

## 2011-01-11 DIAGNOSIS — E559 Vitamin D deficiency, unspecified: Secondary | ICD-10-CM

## 2011-01-11 MED ORDER — OMEPRAZOLE 40 MG PO CPDR: 40.0000 mg | DELAYED_RELEASE_CAPSULE | Freq: Every day | ORAL | Status: DC

## 2011-01-11 MED ORDER — DICLOFENAC SODIUM 75 MG PO TBEC: 75.0000 mg | DELAYED_RELEASE_TABLET | Freq: Two times a day (BID) | ORAL | Status: DC

## 2011-01-11 NOTE — Progress Notes (Signed)
  Subjective:    Patient ID: [name] [name], female    DOB: 02/28/1971, 40 y.o.   MRN: [id number]  HPI Here to follow up after she was diagnosed with dermatomyositis by Dr. Wynelle Cleveland at [facility]. This was made by a skin biopsy. She is talking Diclofenac, and this helps a bit. She has been taking samples of Omeprazole for her epigastric pains, and this works well. Her asthma is stable.    Review of Systems  Constitutional: Negative.   Respiratory: Negative.   Cardiovascular: Negative.   Musculoskeletal: Positive for myalgias.       Objective:   Physical Exam  Constitutional: She appears well-developed and well-nourished.  Neck: No thyromegaly present.  Cardiovascular: Normal rate, regular rhythm, normal heart sounds and intact distal pulses.   Pulmonary/Chest: Effort normal and breath sounds normal.  Abdominal: Soft. Bowel sounds are normal. She exhibits no distension and no mass. There is no tenderness. There is no rebound and no guarding.  Lymphadenopathy:    She has no cervical adenopathy.          Assessment & Plan:  Refilled Omeprazole and Diclofenac. Check a vitamin D level today.

## 2011-01-12 ENCOUNTER — Telehealth: Payer: Self-pay | Admitting: Family Medicine

## 2011-01-12 NOTE — Telephone Encounter (Signed)
Left voice message with results and would like pt to return my call.

## 2011-01-12 NOTE — Telephone Encounter (Signed)
Message copied by Baldemar Friday on Tue Jan 12, 2011  3:59 PM ------      Message from: Gershon Crane A      Created: Tue Jan 12, 2011  8:50 AM       Normal. She can stop the prescription vitamin D and switch to OTC vitamin D. Take 2000 units a day

## 2011-01-12 NOTE — Telephone Encounter (Signed)
Pt called and she did understand my message.

## 2011-01-12 NOTE — Telephone Encounter (Signed)
Message copied by Baldemar Friday on Tue Jan 12, 2011  5:03 PM ------      Message from: Gershon Crane A      Created: Tue Jan 12, 2011  8:50 AM       Normal. She can stop the prescription vitamin D and switch to OTC vitamin D. Take 2000 units a day

## 2011-01-14 LAB — URINALYSIS, ROUTINE W REFLEX MICROSCOPIC
Bilirubin Urine: NEGATIVE
Nitrite: NEGATIVE
Protein, ur: NEGATIVE
Specific Gravity, Urine: 1.024
Urobilinogen, UA: 0.2

## 2011-01-14 LAB — COMPREHENSIVE METABOLIC PANEL
Albumin: 3.9
Alkaline Phosphatase: 51
BUN: 7
Calcium: 9.1
Creatinine, Ser: 0.54
Glucose, Bld: 107 — ABNORMAL HIGH
Potassium: 3.5
Total Protein: 6.8

## 2011-01-14 LAB — CBC
HCT: 32.6 — ABNORMAL LOW
HCT: 38.1
Hemoglobin: 11.3 — ABNORMAL LOW
MCHC: 34.6
MCHC: 34.8
MCHC: 33.6
Platelets: 235
Platelets: 298
Platelets: 267
RBC: 2.73 — ABNORMAL LOW
RDW: 12.7
RDW: 12.2
WBC: 10.7 — ABNORMAL HIGH

## 2011-01-14 LAB — DIFFERENTIAL
Lymphocytes Relative: 21
Lymphs Abs: 2.4
Monocytes Absolute: 0.8 — ABNORMAL HIGH
Monocytes Relative: 7
Neutro Abs: 7.8 — ABNORMAL HIGH
Neutrophils Relative %: 69

## 2011-01-14 LAB — POCT PREGNANCY, URINE
Operator id: 29008
Preg Test, Ur: POSITIVE

## 2011-01-14 LAB — GC/CHLAMYDIA PROBE AMP, GENITAL
Chlamydia, DNA Probe: NEGATIVE
GC Probe Amp, Genital: NEGATIVE

## 2011-01-14 LAB — RPR: RPR Ser Ql: NONREACTIVE

## 2011-01-14 LAB — WET PREP, GENITAL
Trich, Wet Prep: NONE SEEN
Yeast Wet Prep HPF POC: NONE SEEN

## 2011-01-14 LAB — URINE MICROSCOPIC-ADD ON

## 2011-04-28 ENCOUNTER — Ambulatory Visit (INDEPENDENT_AMBULATORY_CARE_PROVIDER_SITE_OTHER): Payer: Managed Care, Other (non HMO) | Admitting: Family Medicine

## 2011-04-28 ENCOUNTER — Encounter: Payer: Self-pay | Admitting: Family Medicine

## 2011-04-28 VITALS — BP 120/78 | HR 109 | Temp 98.4°F | Wt 166.0 lb

## 2011-04-28 DIAGNOSIS — J329 Chronic sinusitis, unspecified: Secondary | ICD-10-CM

## 2011-04-28 MED ORDER — AZITHROMYCIN 250 MG PO TABS: ORAL_TABLET | ORAL | Status: AC

## 2011-04-28 NOTE — Progress Notes (Signed)
  Subjective:    Patient ID: [name] [name], female    DOB: March 12, 1971, 41 y.o.   MRN: [id number]  HPI Here for one week of sinus pressure, HA, PND, and ST. Very little coughing.    Review of Systems  Constitutional: Negative.   HENT: Positive for congestion, postnasal drip and sinus pressure.   Eyes: Negative.   Respiratory: Negative.        Objective:   Physical Exam  Constitutional: She appears well-developed and well-nourished.  HENT:  Right Ear: External ear normal.  Left Ear: External ear normal.  Nose: Nose normal.  Mouth/Throat: Oropharynx is clear and moist. No oropharyngeal exudate.  Eyes: Conjunctivae are normal.  Pulmonary/Chest: Effort normal and breath sounds normal.  Lymphadenopathy:    She has no cervical adenopathy.          Assessment & Plan:  Out of work from 04-26-11 through today

## 2011-07-22 ENCOUNTER — Other Ambulatory Visit: Payer: Self-pay | Admitting: Family Medicine

## 2011-10-28 ENCOUNTER — Ambulatory Visit (INDEPENDENT_AMBULATORY_CARE_PROVIDER_SITE_OTHER): Payer: Managed Care, Other (non HMO) | Admitting: Family

## 2011-10-28 VITALS — BP 136/90 | HR 88 | Temp 98.8°F | Resp 16 | Ht 66.0 in | Wt 161.0 lb

## 2011-10-28 DIAGNOSIS — J019 Acute sinusitis, unspecified: Secondary | ICD-10-CM

## 2011-10-28 MED ORDER — AZITHROMYCIN 250 MG PO TABS: 250.0000 mg | ORAL_TABLET | Freq: Every day | ORAL | Status: AC

## 2011-10-28 MED ORDER — FLUTICASONE PROPIONATE 50 MCG/ACT NA SUSP: 2.0000 | Freq: Every day | NASAL | Status: DC

## 2011-10-28 NOTE — Patient Instructions (Addendum)

## 2011-10-29 ENCOUNTER — Encounter: Payer: Self-pay | Admitting: Family

## 2011-10-29 NOTE — Progress Notes (Signed)
Subjective:    Patient ID: [name] [name], female    DOB: 07-04-1970, 41 y.o.   MRN: [id number]  HPI 41 year old white female, one pack per day smoker, patient of Dr. Cato Mulligan is in today with complaints of sinus pressure, sneezing, congestion, fever, muscle aches and pain x2 days. She's been taken over-the-counter medication with no relief. She has a history of acute sinusitis.   Review of Systems  Constitutional: Negative.   HENT: Positive for congestion, rhinorrhea, sneezing, postnasal drip and sinus pressure.   Respiratory: Positive for cough.   Cardiovascular: Negative.   Musculoskeletal: Negative.   Skin: Negative.   Neurological: Negative.   Hematological: Negative.   Psychiatric/Behavioral: Negative.    Past Medical History  Diagnosis Date  . ANXIETY 08/22/2007  . NICOTINE ADDICTION 06/26/2007  . DEPRESSION 08/22/2007  . ALLERGIC RHINITIS 06/26/2007  . ASTHMA 06/26/2007  . ASTHMA, WITH ACUTE EXACERBATION 07/26/2007  . HYPERGLYCEMIA 08/22/2007  . NONSPECIFIC ABNORM RESULTS THYROID FUNCT STUDY 08/22/2007  . MYALGIA 04/15/2010    History   Social History  . Marital Status: Divorced    Spouse Name: N/A    Number of Children: N/A  . Years of Education: N/A   Occupational History  . Not on file.   Social History Main Topics  . Smoking status: Current Everyday Smoker -- 1.0 packs/day for 27 years    Types: Cigarettes  . Smokeless tobacco: Never Used  . Alcohol Use: 1.2 oz/week    2 Cans of beer per week  . Drug Use: No  . Sexually Active: Not on file   Other Topics Concern  . Not on file   Social History Narrative  . No narrative on file    No past surgical history on file.  No family history on file.  Allergies  Allergen Reactions  . Codeine     Current Outpatient Prescriptions on File Prior to Visit  Medication Sig Dispense Refill  . cholecalciferol (VITAMIN D) 400 UNITS TABS Take 400 Units by mouth 2 (two) times daily.      . citalopram (CELEXA) 20  MG tablet TAKE 1 TABLET EVERY DAY  30 tablet  8  . diazepam (VALIUM) 5 MG tablet Take 5 mg by mouth. Use as needed.       . diclofenac (VOLTAREN) 75 MG EC tablet Take 1 tablet (75 mg total) by mouth 2 (two) times daily.  60 tablet  11  . LORazepam (ATIVAN) 0.5 MG tablet Take 0.5 mg by mouth every 8 (eight) hours as needed.        Marland Kitchen omeprazole (PRILOSEC) 40 MG capsule Take 1 capsule (40 mg total) by mouth daily.  30 capsule  11  . VENTOLIN HFA 108 (90 BASE) MCG/ACT inhaler USE 2 PUFFS BY MOUTH EVERY 4 HOURS AS NEEDED  18 g  2  . fluticasone (FLONASE) 50 MCG/ACT nasal spray Place 2 sprays into the nose daily.  16 g  6    BP 136/90  Pulse 88  Temp 98.8 F (37.1 C)  Resp 16  Ht 5\' 6"  (1.676 m)  Wt 161 lb (73.029 kg)  BMI 25.99 kg/m2chart    Objective:   Physical Exam  Constitutional: She is oriented to person, place, and time. She appears well-developed and well-nourished.  HENT:  Right Ear: External ear normal.  Left Ear: External ear normal.  Nose: Nose normal.  Mouth/Throat: Oropharynx is clear and moist.       Sinus pressure.   Neck: Normal range of motion.  Neck supple.  Cardiovascular: Normal rate, regular rhythm and normal heart sounds.   Pulmonary/Chest: Effort normal and breath sounds normal.  Neurological: She is alert and oriented to person, place, and time.  Skin: Skin is warm and dry.  Psychiatric: She has a normal mood and affect.          Assessment & Plan:  Assessment: Acute sinusitis, cough  Plan: Z-Pak as directed. Flonase 2 sprays in each nostril once a day. Patient call the office symptoms worsen or persist. Recheck a schedule, appearing.

## 2011-11-07 ENCOUNTER — Other Ambulatory Visit: Payer: Self-pay | Admitting: Family Medicine

## 2012-01-16 ENCOUNTER — Other Ambulatory Visit: Payer: Self-pay | Admitting: Family Medicine

## 2012-01-20 ENCOUNTER — Other Ambulatory Visit: Payer: Self-pay | Admitting: Family Medicine

## 2012-02-25 ENCOUNTER — Other Ambulatory Visit: Payer: Self-pay | Admitting: Family Medicine

## 2012-03-29 ENCOUNTER — Other Ambulatory Visit: Payer: Self-pay | Admitting: Family Medicine

## 2012-04-05 ENCOUNTER — Ambulatory Visit (INDEPENDENT_AMBULATORY_CARE_PROVIDER_SITE_OTHER): Payer: Managed Care, Other (non HMO) | Admitting: Family Medicine

## 2012-04-05 ENCOUNTER — Encounter: Payer: Self-pay | Admitting: Family Medicine

## 2012-04-05 VITALS — BP 150/80 | HR 87 | Temp 98.6°F | Wt 177.0 lb

## 2012-04-05 DIAGNOSIS — F411 Generalized anxiety disorder: Secondary | ICD-10-CM

## 2012-04-05 DIAGNOSIS — F3289 Other specified depressive episodes: Secondary | ICD-10-CM

## 2012-04-05 DIAGNOSIS — F329 Major depressive disorder, single episode, unspecified: Secondary | ICD-10-CM

## 2012-04-05 DIAGNOSIS — J45909 Unspecified asthma, uncomplicated: Secondary | ICD-10-CM

## 2012-04-05 DIAGNOSIS — F17200 Nicotine dependence, unspecified, uncomplicated: Secondary | ICD-10-CM

## 2012-04-05 MED ORDER — ALBUTEROL SULFATE HFA 108 (90 BASE) MCG/ACT IN AERS: 2.0000 | INHALATION_SPRAY | RESPIRATORY_TRACT | Status: DC | PRN

## 2012-04-05 MED ORDER — CITALOPRAM HYDROBROMIDE 20 MG PO TABS: 20.0000 mg | ORAL_TABLET | Freq: Every day | ORAL | Status: DC

## 2012-04-05 MED ORDER — VARENICLINE TARTRATE 1 MG PO TABS: 1.0000 mg | ORAL_TABLET | Freq: Two times a day (BID) | ORAL | Status: DC

## 2012-04-05 MED ORDER — LORAZEPAM 0.5 MG PO TABS: 0.5000 mg | ORAL_TABLET | Freq: Three times a day (TID) | ORAL | Status: DC | PRN

## 2012-04-05 MED ORDER — DIAZEPAM 5 MG PO TABS: ORAL_TABLET | ORAL | Status: DC

## 2012-04-05 MED ORDER — VARENICLINE TARTRATE 0.5 MG X 11 & 1 MG X 42 PO MISC: ORAL | Status: DC

## 2012-04-05 NOTE — Progress Notes (Signed)
  Subjective:    Patient ID: [name] [name], female    DOB: 09/30/1970, 42 y.o.   MRN: [id number]  HPI Here for refills on meds. She is doing well in general. Her anxiety is stable. She is busy with her work and loves her job with [employer]. Her asthma is stable. She wants to try to quit smoking again.    Review of Systems  Constitutional: Negative.   Respiratory: Negative.   Cardiovascular: Negative.   Neurological: Negative.   Psychiatric/Behavioral: Negative.        Objective:   Physical Exam  Constitutional: She appears well-developed and well-nourished.  Cardiovascular: Normal rate, regular rhythm, normal heart sounds and intact distal pulses.   Pulmonary/Chest: Effort normal and breath sounds normal.  Lymphadenopathy:    She has no cervical adenopathy.  Psychiatric: She has a normal mood and affect. Her behavior is normal. Thought content normal.          Assessment & Plan:  Given Chantix. Refilled meds.

## 2012-04-06 ENCOUNTER — Telehealth: Payer: Self-pay | Admitting: Family Medicine

## 2012-04-06 DIAGNOSIS — L608 Other nail disorders: Secondary | ICD-10-CM

## 2012-04-06 NOTE — Telephone Encounter (Signed)
Pt needs Dr Clent Ridges to send referral to Dr Jeremy Johann, [facility]. [phone]. Pt called her dermatologist, (Dr Reche Dixon is pt's dermologist. and his nurse Misty Stanley referred pt  to Dr Elvin So). .   But Dr Pearce's office wants the referral from Dr Clent Ridges since Dr Clent Ridges is the one that actually saw pt's toe. (Index toe left foot).   Dr Elvin So is a dermatology surgeon.

## 2012-04-07 NOTE — Telephone Encounter (Signed)
done

## 2012-07-05 DIAGNOSIS — Z0279 Encounter for issue of other medical certificate: Secondary | ICD-10-CM

## 2012-07-07 ENCOUNTER — Encounter: Payer: Self-pay | Admitting: Family Medicine

## 2012-07-07 ENCOUNTER — Ambulatory Visit (INDEPENDENT_AMBULATORY_CARE_PROVIDER_SITE_OTHER): Payer: Managed Care, Other (non HMO) | Admitting: Family Medicine

## 2012-07-07 ENCOUNTER — Ambulatory Visit: Payer: Managed Care, Other (non HMO) | Admitting: Family

## 2012-07-07 VITALS — BP 146/98 | HR 97 | Temp 98.1°F | Wt 171.0 lb

## 2012-07-07 DIAGNOSIS — L723 Sebaceous cyst: Secondary | ICD-10-CM

## 2012-07-07 DIAGNOSIS — B9789 Other viral agents as the cause of diseases classified elsewhere: Secondary | ICD-10-CM

## 2012-07-07 DIAGNOSIS — B349 Viral infection, unspecified: Secondary | ICD-10-CM

## 2012-07-07 MED ORDER — DOXYCYCLINE HYCLATE 100 MG PO CAPS: 100.0000 mg | ORAL_CAPSULE | Freq: Two times a day (BID) | ORAL | Status: AC

## 2012-07-07 NOTE — Progress Notes (Signed)
  Subjective:    Patient ID: [name] [name], female    DOB: 1971-01-02, 42 y.o.   MRN: [id number]  HPI  Here for 4 days of stuffy head, PND, ST, dry cough, nausea without vomiting, and diarrhea. No fever. Drinking fluids. Also she has had 2 tender bumps in the left groin area for a week. She is worried this could be MRSA. She has never had MRSA, but her roommate was recently treated for this.    Review of Systems  Constitutional: Negative.  Negative for fever.  HENT: Positive for congestion and postnasal drip. Negative for sinus pressure.   Eyes: Negative.   Respiratory: Positive for cough.   Gastrointestinal: Positive for nausea and diarrhea. Negative for vomiting, abdominal pain, constipation, blood in stool and abdominal distention.       Objective:   Physical Exam  Constitutional: She appears well-developed and well-nourished.  HENT:  Right Ear: External ear normal.  Left Ear: External ear normal.  Nose: Nose normal.  Mouth/Throat: Oropharynx is clear and moist.  Eyes: Conjunctivae are normal.  Neck: No thyromegaly present.  Pulmonary/Chest: Effort normal and breath sounds normal.  Abdominal: Soft. Bowel sounds are normal. She exhibits no distension and no mass. There is no tenderness. There is no rebound and no guarding.  Lymphadenopathy:    She has no cervical adenopathy.  Skin:  The left groin has 2 mildly tender firm papular lesions, not fluctuant           Assessment & Plan:  She has a viral illness that should pass in a few days. Drink plenty of fluids. Cover for possible MRSA with Doxycycline. Out of work 07-04-12 through today .

## 2012-07-20 ENCOUNTER — Other Ambulatory Visit: Payer: Self-pay | Admitting: Family Medicine

## 2012-08-02 ENCOUNTER — Telehealth: Payer: Self-pay | Admitting: Family Medicine

## 2012-08-02 NOTE — Telephone Encounter (Signed)
Patient Information:  Caller Name: Peg  Phone: [phone]  Patient: [name], [name]  Gender: Female  DOB: 09/26/1970  Age: 42 Years  PCP: Gershon Crane [facility])  Pregnant: No  Office Follow Up:  Does the office need to follow up with this patient?: Yes  Instructions For The Office: Please review and advise patient concerning low Vitamin D. Can reach patient at  [phone].   Symptoms  Reason For Call & Symptoms: Found out today 08/02/2012  that Vitamin D Level is low at 14.6 at Dr Misha/Rheumatologist at [facility].  Told to call Dr Clent Ridges for treatment.  Reviewed Health History In EMR: Yes  Reviewed Medications In EMR: Yes  Reviewed Allergies In EMR: Yes  Reviewed Surgeries / Procedures: Yes  Date of Onset of Symptoms: Unknown OB / GYN:  LMP: Unknown  Guideline(s) Used:  No Protocol Available - Information Only  Disposition Per Guideline:   Discuss with PCP and Callback by Nurse Today  Reason For Disposition Reached:   Nursing judgment  Advice Given:  N/A  Patient Will Follow Care Advice:  YES

## 2012-08-03 MED ORDER — ERGOCALCIFEROL 1.25 MG (50000 UT) PO CAPS: 50000.0000 [IU] | ORAL_CAPSULE | ORAL | Status: DC

## 2012-08-03 NOTE — Telephone Encounter (Signed)
Call in vitamin D 50,000 unit pills to take weekly for 12 weeks, then we will check a level

## 2012-08-03 NOTE — Telephone Encounter (Signed)
I do not see in her med list where she has been rxd prescription strength vitamin d. The 400iu list is OTC strength, however I will forward message on to [facility] for further review

## 2012-08-03 NOTE — Telephone Encounter (Signed)
Per Dr. Claris Che request called rx in to pharmacy.  Called and spoke with pt and pt is aware of Dr. Claris Che instructions.

## 2012-08-03 NOTE — Telephone Encounter (Signed)
Caller: Arnetra/Patient; Phone: [phone]; Reason for Call: Patient calling in regards to her phone call on 08/02/12 reporting a low Vitamin D level.  Reviewed EPIC chart, and attempted to call in order from Dr.  Clent Ridges for Vitamin D pills to [facility] on [facility]: phone number: [phone].  Unable to reach anyone in the pharmacy after calling numerous times.  Called patient back to verify phone number for pharmacy but there was no answer.  Left message instructing patient to call office back to verify phone number for pharmacy.

## 2012-08-16 ENCOUNTER — Encounter: Payer: Self-pay | Admitting: Family Medicine

## 2012-08-16 ENCOUNTER — Ambulatory Visit (INDEPENDENT_AMBULATORY_CARE_PROVIDER_SITE_OTHER): Payer: Managed Care, Other (non HMO) | Admitting: Family Medicine

## 2012-08-16 VITALS — BP 130/80 | HR 97 | Temp 98.2°F | Wt 170.0 lb

## 2012-08-16 DIAGNOSIS — K589 Irritable bowel syndrome without diarrhea: Secondary | ICD-10-CM

## 2012-08-16 NOTE — Progress Notes (Signed)
  Subjective:    Patient ID: [name] [name], female    DOB: [birth year], 42 y.o.   MRN: [id number]  HPI Here for 2 things. First one month ago she changed her diet to go gluten free, and her bowel habits have changed. Now she alternates between constipation and loose stools. No fever or nausea. Also several days ago she felt a sudden mild pain in the pelvic area which lasted 2 days and then started going away. Today there is almost no pain at all. Her LMP was 2 weeks ago. No vaginal DC. She is due to see her GYN in 3 weeks.    Review of Systems  Constitutional: Negative.   Gastrointestinal: Positive for diarrhea and constipation. Negative for nausea, vomiting, abdominal pain, blood in stool, abdominal distention and rectal pain.  Genitourinary: Positive for pelvic pain. Negative for dysuria, urgency and frequency.       Objective:   Physical Exam  Constitutional: She appears well-developed and well-nourished. No distress.  Abdominal: Soft. Bowel sounds are normal. She exhibits no distension and no mass. There is no tenderness. There is no rebound and no guarding.          Assessment & Plan:  It is not clear what the etiology of her pelvic discomfort was but it appears to be resolving. She will see GYN as above or return to [place] if she starts feeling worse again. For the IBS I advised her to try Miralax daily.

## 2012-08-31 ENCOUNTER — Other Ambulatory Visit: Payer: Self-pay | Admitting: Obstetrics and Gynecology

## 2012-08-31 DIAGNOSIS — R1031 Right lower quadrant pain: Secondary | ICD-10-CM

## 2012-09-01 ENCOUNTER — Emergency Department (HOSPITAL_BASED_OUTPATIENT_CLINIC_OR_DEPARTMENT_OTHER)
Admission: EM | Admit: 2012-09-01 | Discharge: 2012-09-01 | Disposition: A | Discharge status: Home / Self Care | Payer: Managed Care, Other (non HMO) | Attending: Emergency Medicine | Admitting: Emergency Medicine

## 2012-09-01 ENCOUNTER — Encounter (HOSPITAL_BASED_OUTPATIENT_CLINIC_OR_DEPARTMENT_OTHER): Payer: Self-pay

## 2012-09-01 ENCOUNTER — Emergency Department (HOSPITAL_BASED_OUTPATIENT_CLINIC_OR_DEPARTMENT_OTHER): Payer: Managed Care, Other (non HMO)

## 2012-09-01 DIAGNOSIS — Z3202 Encounter for pregnancy test, result negative: Secondary | ICD-10-CM | POA: Insufficient documentation

## 2012-09-01 DIAGNOSIS — Z79899 Other long term (current) drug therapy: Secondary | ICD-10-CM | POA: Insufficient documentation

## 2012-09-01 DIAGNOSIS — R197 Diarrhea, unspecified: Secondary | ICD-10-CM | POA: Insufficient documentation

## 2012-09-01 DIAGNOSIS — Z8709 Personal history of other diseases of the respiratory system: Secondary | ICD-10-CM | POA: Insufficient documentation

## 2012-09-01 DIAGNOSIS — R110 Nausea: Secondary | ICD-10-CM | POA: Insufficient documentation

## 2012-09-01 DIAGNOSIS — F17200 Nicotine dependence, unspecified, uncomplicated: Secondary | ICD-10-CM | POA: Insufficient documentation

## 2012-09-01 DIAGNOSIS — R1031 Right lower quadrant pain: Secondary | ICD-10-CM | POA: Insufficient documentation

## 2012-09-01 DIAGNOSIS — IMO0002 Reserved for concepts with insufficient information to code with codable children: Secondary | ICD-10-CM | POA: Insufficient documentation

## 2012-09-01 DIAGNOSIS — Z8739 Personal history of other diseases of the musculoskeletal system and connective tissue: Secondary | ICD-10-CM | POA: Insufficient documentation

## 2012-09-01 DIAGNOSIS — Z9889 Other specified postprocedural states: Secondary | ICD-10-CM | POA: Insufficient documentation

## 2012-09-01 DIAGNOSIS — F329 Major depressive disorder, single episode, unspecified: Secondary | ICD-10-CM | POA: Insufficient documentation

## 2012-09-01 DIAGNOSIS — R109 Unspecified abdominal pain: Secondary | ICD-10-CM

## 2012-09-01 DIAGNOSIS — R300 Dysuria: Secondary | ICD-10-CM | POA: Insufficient documentation

## 2012-09-01 DIAGNOSIS — J45909 Unspecified asthma, uncomplicated: Secondary | ICD-10-CM | POA: Insufficient documentation

## 2012-09-01 DIAGNOSIS — F3289 Other specified depressive episodes: Secondary | ICD-10-CM | POA: Insufficient documentation

## 2012-09-01 DIAGNOSIS — F411 Generalized anxiety disorder: Secondary | ICD-10-CM | POA: Insufficient documentation

## 2012-09-01 DIAGNOSIS — Z9079 Acquired absence of other genital organ(s): Secondary | ICD-10-CM | POA: Insufficient documentation

## 2012-09-01 DIAGNOSIS — K5900 Constipation, unspecified: Secondary | ICD-10-CM | POA: Insufficient documentation

## 2012-09-01 HISTORY — DX: Dermatopolymyositis, unspecified, organ involvement unspecified: M33.90

## 2012-09-01 HISTORY — DX: Other dermatomyositis without myopathy: M33.13

## 2012-09-01 HISTORY — DX: Fibromyalgia: M79.7

## 2012-09-01 HISTORY — DX: Other pneumothorax: J93.83

## 2012-09-01 LAB — COMPREHENSIVE METABOLIC PANEL
CO2: 23 mEq/L (ref 19–32)
Calcium: 9.4 mg/dL (ref 8.4–10.5)
Creatinine, Ser: 0.6 mg/dL (ref 0.50–1.10)
GFR calc Af Amer: >90 mL/min (ref >90)
GFR calc non Af Amer: >90 mL/min (ref >90)
Glucose, Bld: 115 mg/dL — ABNORMAL HIGH (ref 70–99)
Sodium: 139 mEq/L (ref 135–145)
Total Protein: 7.4 g/dL (ref 6.0–8.3)

## 2012-09-01 LAB — PREGNANCY, URINE: Preg Test, Ur: NEGATIVE

## 2012-09-01 LAB — CBC WITH DIFFERENTIAL/PLATELET
Basophils Absolute: 0 10*3/uL (ref 0.0–0.1)
Eosinophils Absolute: 0.2 10*3/uL (ref 0.0–0.7)
Eosinophils Relative: 3 % (ref 0–5)
HCT: 40 % (ref 36.0–46.0)
Lymphocytes Relative: 40 % (ref 12–46)
Lymphs Abs: 3.4 10*3/uL (ref 0.7–4.0)
MCH: 34.1 pg — ABNORMAL HIGH (ref 26.0–34.0)
MCV: 97.6 fL (ref 78.0–100.0)
Monocytes Absolute: 0.7 10*3/uL (ref 0.1–1.0)
Platelets: 297 10*3/uL (ref 150–400)
RDW: 12.5 % (ref 11.5–15.5)
WBC: 8.5 10*3/uL (ref 4.0–10.5)

## 2012-09-01 LAB — URINALYSIS, ROUTINE W REFLEX MICROSCOPIC
Leukocytes, UA: NEGATIVE
Nitrite: NEGATIVE
Specific Gravity, Urine: 1.03 (ref 1.005–1.030)
Urobilinogen, UA: 0.2 mg/dL (ref 0.0–1.0)
pH: 5.5 (ref 5.0–8.0)

## 2012-09-01 MED ORDER — ONDANSETRON 8 MG PO TBDP: ORAL_TABLET | ORAL | Status: DC

## 2012-09-01 MED ORDER — IOHEXOL 300 MG/ML  SOLN
50.0000 mL | Freq: Once | INTRAMUSCULAR | Status: AC | PRN
  Administered 2012-09-01: 50 mL via ORAL

## 2012-09-01 MED ORDER — IOHEXOL 300 MG/ML  SOLN
100.0000 mL | Freq: Once | INTRAMUSCULAR | Status: AC | PRN
  Administered 2012-09-01: 100 mL via INTRAVENOUS

## 2012-09-01 NOTE — ED Provider Notes (Signed)
History     CSN: [id number]  Arrival date & time 09/01/12  0703   First MD Initiated Contact with Patient 09/01/12 [phone]      Chief Complaint  Patient presents with  . Abdominal Pain  . Nausea    (Consider location/radiation/quality/duration/timing/severity/associated sxs/prior treatment) HPI Comments: Patient presents with a 2 month history of some intermittent abdominal pain. She's had some constipation alternating with diarrhea. Over the last week she started developing a more intense pain in her right lower quadrant. She states the pain has been worsening over last week. She had a wave of nausea but no vomiting earlier this morning. She denies any fevers or chills. She's had some mild burning on urination for the last 2 or 3 weeks intermittently. She denies any vaginal bleeding or discharge. She's been seen by her primary care physician. She saw her OB/GYN yesterday who did a pelvic ultrasound and a pelvic exam. She states that was normal. She has an appointment to followup with Dr. Loreta Ave with gastroenterology on Monday but she came in here this morning because her right lower quadrant pain was intensified.   Past Medical History  Diagnosis Date  . ANXIETY 08/22/2007  . NICOTINE ADDICTION 06/26/2007  . DEPRESSION 08/22/2007  . ALLERGIC RHINITIS 06/26/2007  . ASTHMA 06/26/2007  . ASTHMA, WITH ACUTE EXACERBATION 07/26/2007  . HYPERGLYCEMIA 08/22/2007  . NONSPECIFIC ABNORM RESULTS THYROID FUNCT STUDY 08/22/2007  . MYALGIA 04/15/2010  . Fibromyalgia   . DM (dermatomyositis)   . Spontaneous pneumothorax     Past Surgical History  Procedure Laterality Date  . Fallopian tubectomy Right 2005  . Cervical conization w/bx    . Lung surgery    . Knee surgery      No family history on file.  History  Substance Use Topics  . Smoking status: Current Every Day Smoker -- 0.50 packs/day for 27 years    Types: Cigarettes  . Smokeless tobacco: Never Used  . Alcohol Use: 1.0 oz/week    2  drink(s) per week     Comment: 2 x weeks    OB History   Grav Para Term Preterm Abortions TAB SAB Ect Mult Living                  Review of Systems  Constitutional: Negative for fever, chills, diaphoresis and fatigue.  HENT: Negative for congestion, rhinorrhea and sneezing.   Eyes: Negative.   Respiratory: Negative for cough, chest tightness and shortness of breath.   Cardiovascular: Negative for chest pain and leg swelling.  Gastrointestinal: Positive for abdominal pain, diarrhea and constipation. Negative for nausea, vomiting and blood in stool.  Genitourinary: Positive for dysuria. Negative for frequency, hematuria, flank pain and difficulty urinating.  Musculoskeletal: Negative for back pain and arthralgias.  Skin: Negative for rash.  Neurological: Negative for dizziness, speech difficulty, weakness, numbness and headaches.    Allergies  Codeine  Home Medications   Current Outpatient Rx  Name  Route  Sig  Dispense  Refill  . citalopram (CELEXA) 20 MG tablet   Oral   Take 1 tablet (20 mg total) by mouth daily.   30 tablet   11   . cyclobenzaprine (FLEXERIL) 10 MG tablet   Oral   Take 10 mg by mouth at bedtime as needed.         . diclofenac (VOLTAREN) 75 MG EC tablet      TAKE 1 TABLET (75 MG TOTAL) BY MOUTH 2 (TWO) TIMES DAILY.   60  tablet   9   . ergocalciferol (VITAMIN D2) 50000 UNITS capsule   Oral   Take 1 capsule (50,000 Units total) by mouth once a week.   12 capsule   0   . fluticasone (FLONASE) 50 MCG/ACT nasal spray   Nasal   Place 2 sprays into the nose daily.   16 g   6   . hydroxychloroquine (PLAQUENIL) 200 MG tablet   Oral   Take by mouth daily.         Marland Kitchen LORazepam (ATIVAN) 0.5 MG tablet   Oral   Take 1 tablet (0.5 mg total) by mouth every 8 (eight) hours as needed for anxiety.   90 tablet   5   . Multiple Vitamin (MULTIVITAMIN) tablet   Oral   Take 1 tablet by mouth daily.         Marland Kitchen omeprazole (PRILOSEC) 40 MG capsule       TAKE 1 CAPSULE (40 MG TOTAL) BY MOUTH DAILY.   30 capsule   8   . ondansetron (ZOFRAN ODT) 8 MG disintegrating tablet      8mg  ODT q4 hours prn nausea   8 tablet   0   . varenicline (CHANTIX CONTINUING MONTH PAK) 1 MG tablet   Oral   Take 1 tablet (1 mg total) by mouth 2 (two) times daily.   60 tablet   1   . varenicline (CHANTIX STARTING MONTH PAK) 0.5 MG X 11 & 1 MG X 42 tablet      Take one 0.5 mg tablet by mouth once daily for 3 days, then increase to one 0.5 mg tablet twice daily for 4 days, then increase to one 1 mg tablet twice daily.   53 tablet   0   . VENTOLIN HFA 108 (90 BASE) MCG/ACT inhaler      INHALE 2 PUFFS BY MOUTH EVERY 4 HOURS AS NEEDED   18 each   1     BP 133/82  Pulse 76  Temp(Src) 97.6 F (36.4 C) (Oral)  Resp 16  Ht 5\' 7"  (1.702 m)  Wt 170 lb (77.111 kg)  BMI 26.62 kg/m2  SpO2 100%  LMP 08/22/2012  Physical Exam  Constitutional: She is oriented to person, place, and time. She appears well-developed and well-nourished.  HENT:  Head: Normocephalic and atraumatic.  Eyes: Pupils are equal, round, and reactive to light.  Neck: Normal range of motion. Neck supple.  Cardiovascular: Normal rate, regular rhythm and normal heart sounds.   Pulmonary/Chest: Effort normal and breath sounds normal. No respiratory distress. She has no wheezes. She has no rales. She exhibits no tenderness.  Abdominal: Soft. Bowel sounds are normal. There is tenderness (Mild tenderness to the right lower quadrant. No CVA tenderness). There is no rebound and no guarding.  Musculoskeletal: Normal range of motion. She exhibits no edema.  Lymphadenopathy:    She has no cervical adenopathy.  Neurological: She is alert and oriented to person, place, and time.  Skin: Skin is warm and dry. No rash noted.  Psychiatric: She has a normal mood and affect.    ED Course  Procedures (including critical care time)  Results for orders placed during the hospital encounter of  09/01/12  CBC WITH DIFFERENTIAL      Result Value Range   WBC 8.5  4.0 - 10.5 K/uL   RBC 4.10  3.87 - 5.11 MIL/uL   Hemoglobin 14.0  12.0 - 15.0 g/dL   HCT 16.1  09.6 -  46.0 %   MCV 97.6  78.0 - 100.0 fL   MCH 34.1 (*) 26.0 - 34.0 pg   MCHC 35.0  30.0 - 36.0 g/dL   RDW 16.1  09.6 - 04.5 %   Platelets 297  150 - 400 K/uL   Neutrophils Relative % 49  43 - 77 %   Neutro Abs 4.2  1.7 - 7.7 K/uL   Lymphocytes Relative 40  12 - 46 %   Lymphs Abs 3.4  0.7 - 4.0 K/uL   Monocytes Relative 8  3 - 12 %   Monocytes Absolute 0.7  0.1 - 1.0 K/uL   Eosinophils Relative 3  0 - 5 %   Eosinophils Absolute 0.2  0.0 - 0.7 K/uL   Basophils Relative 0  0 - 1 %   Basophils Absolute 0.0  0.0 - 0.1 K/uL  COMPREHENSIVE METABOLIC PANEL      Result Value Range   Sodium 139  135 - 145 mEq/L   Potassium 3.7  3.5 - 5.1 mEq/L   Chloride 105  96 - 112 mEq/L   CO2 23  19 - 32 mEq/L   Glucose, Bld 115 (*) 70 - 99 mg/dL   BUN 11  6 - 23 mg/dL   Creatinine, Ser 4.09  0.50 - 1.10 mg/dL   Calcium 9.4  8.4 - 81.1 mg/dL   Total Protein 7.4  6.0 - 8.3 g/dL   Albumin 3.9  3.5 - 5.2 g/dL   AST 48 (*) 0 - 37 U/L   ALT 57 (*) 0 - 35 U/L   Alkaline Phosphatase 81  39 - 117 U/L   Total Bilirubin 0.3  0.3 - 1.2 mg/dL   GFR calc non Af Amer >90  >90 mL/min   GFR calc Af Amer >90  >90 mL/min  URINALYSIS, ROUTINE W REFLEX MICROSCOPIC      Result Value Range   Color, Urine YELLOW  YELLOW   APPearance CLOUDY (*) CLEAR   Specific Gravity, Urine 1.030  1.005 - 1.030   pH 5.5  5.0 - 8.0   Glucose, UA NEGATIVE  NEGATIVE mg/dL   Hgb urine dipstick NEGATIVE  NEGATIVE   Bilirubin Urine NEGATIVE  NEGATIVE   Ketones, ur NEGATIVE  NEGATIVE mg/dL   Protein, ur NEGATIVE  NEGATIVE mg/dL   Urobilinogen, UA 0.2  0.0 - 1.0 mg/dL   Nitrite NEGATIVE  NEGATIVE   Leukocytes, UA NEGATIVE  NEGATIVE  PREGNANCY, URINE      Result Value Range   Preg Test, Ur NEGATIVE  NEGATIVE   Ct Abdomen Pelvis W Contrast  09/01/2012   *RADIOLOGY  REPORT*  Clinical Data: Abdominal pain.  Nausea.  CT ABDOMEN AND PELVIS WITH CONTRAST  Technique:  Multidetector CT imaging of the abdomen and pelvis was performed following the standard protocol during bolus administration of intravenous contrast.  Contrast: 50mL OMNIPAQUE IOHEXOL 300 MG/ML  SOLN, OMNIPAQUE IOHEXOL 300 MG/ML  SOLN  Comparison: None.  Findings: The lung bases are clear.  No pleural or pericardial effusion.  The gallbladder, liver, spleen, adrenal glands, pancreas and kidneys appear normal.  The stomach, small and large bowel and appendix appear normal.  Uterus, adnexa and urinary bladder are unremarkable.  There is no lymphadenopathy or fluid.  No lytic or sclerotic bony lesion is identified.  There is convex left scoliosis.  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  The left L4-5 facet  demonstrates advanced degenerative change.  IMPRESSION:  1.  No acute finding or finding to explain the patient's symptoms. 2.  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   Original Report Authenticated By: Holley Dexter, M.D.     1. Abdominal pain       MDM  There is no evidence of appendicitis or kidney stone. Patient had a pelvic ultrasound yesterday I did not feel this needed to be repeated. She is comfortable-appearing she was discharged home to followup with her gastroenterologist on Monday.       Rolan Bucco, MD 09/01/12 [phone]

## 2012-09-01 NOTE — Discharge Instructions (Signed)

## 2012-09-01 NOTE — ED Notes (Signed)
Pt reports a 2 month history of intermittent abdominal pain, constipation and diarrhea.  She was seen by PMD and OBGYN yesterday, had a normal pelvic exam and ultrasound.  She is scheduled for a CT abdomen Monday but woke up with RLQ pain radiating to right flank, associated with nausea this am.  She also reports urinary burning x 2 weeks.

## 2012-09-01 NOTE — ED Notes (Signed)
MD at bedside. 

## 2012-09-03 ENCOUNTER — Encounter: Payer: Self-pay | Admitting: Family Medicine

## 2012-09-04 ENCOUNTER — Other Ambulatory Visit: Payer: Managed Care, Other (non HMO)

## 2012-09-04 ENCOUNTER — Telehealth: Payer: Self-pay | Admitting: Family Medicine

## 2012-09-04 DIAGNOSIS — M4120 Other idiopathic scoliosis, site unspecified: Secondary | ICD-10-CM

## 2012-09-04 NOTE — Telephone Encounter (Signed)
The referral was done  

## 2012-09-04 NOTE — Telephone Encounter (Signed)
See below note.

## 2012-10-12 ENCOUNTER — Encounter: Payer: Self-pay | Admitting: Family Medicine

## 2012-10-12 ENCOUNTER — Ambulatory Visit (INDEPENDENT_AMBULATORY_CARE_PROVIDER_SITE_OTHER): Payer: Managed Care, Other (non HMO) | Admitting: Family Medicine

## 2012-10-12 VITALS — BP 156/98 | HR 96 | Temp 98.6°F | Wt 174.0 lb

## 2012-10-12 DIAGNOSIS — E559 Vitamin D deficiency, unspecified: Secondary | ICD-10-CM

## 2012-10-12 DIAGNOSIS — M2550 Pain in unspecified joint: Secondary | ICD-10-CM

## 2012-10-12 DIAGNOSIS — K219 Gastro-esophageal reflux disease without esophagitis: Secondary | ICD-10-CM

## 2012-10-13 NOTE — Progress Notes (Signed)
  Subjective:    Patient ID: [name] [name], female    DOB: January 22, 1971, 42 y.o.   MRN: [id number]  HPI Here asking about upper abdominal pains, nausea, and joint pains. She has been using Diclofenac bid with good control of her joint pains, but in the past week she developed epigastric pains and nausea without vomiting. No fever or change in bowel habits. She saw Dr. Loreta Ave this morning, and she felt these were side effects of NSAID use. She told her to stop taking Diclofenac. She asks about what to use for joint pains, since Tramadol makes her too sleepy to work on it.    Review of Systems  Constitutional: Negative.   Gastrointestinal: Positive for nausea and abdominal pain. Negative for vomiting, diarrhea, constipation, blood in stool and abdominal distention.  Musculoskeletal: Positive for arthralgias.       Objective:   Physical Exam  Constitutional: She appears well-developed and well-nourished.  Abdominal: Soft. Bowel sounds are normal. She exhibits no distension and no mass. There is no tenderness. There is no rebound and no guarding.          Assessment & Plan:  I agree that she is having GI side effects from NSAIDs, so she will avoid these. Try Tylenol 1000 mg bid instead. Recheck prn

## 2012-10-16 ENCOUNTER — Encounter: Payer: Self-pay | Admitting: Family Medicine

## 2012-10-16 NOTE — Progress Notes (Signed)
Quick Note:  I released results in my chart. ______ 

## 2012-10-26 ENCOUNTER — Other Ambulatory Visit: Payer: Managed Care, Other (non HMO)

## 2012-11-05 ENCOUNTER — Other Ambulatory Visit: Payer: Self-pay | Admitting: Family Medicine

## 2012-12-13 ENCOUNTER — Other Ambulatory Visit: Payer: Self-pay | Admitting: Family Medicine

## 2012-12-13 NOTE — Telephone Encounter (Signed)
Call in Lorazepam #90 with 5 rf. Okay to refill the other meds for one year

## 2012-12-13 NOTE — Telephone Encounter (Signed)
Can we refill this? 

## 2012-12-13 NOTE — Telephone Encounter (Signed)
Refill request also came in for Lorazepam 0.5 mg take 1 po q8hrs.

## 2012-12-14 MED ORDER — LORAZEPAM 0.5 MG PO TABS: 0.5000 mg | ORAL_TABLET | Freq: Three times a day (TID) | ORAL | Status: DC | PRN

## 2012-12-14 NOTE — Addendum Note (Signed)
Addended by: Aniceto Boss A on: 12/14/2012 10:36 AM   Modules accepted: Orders

## 2012-12-14 NOTE — Telephone Encounter (Signed)
Script was called in.

## 2013-01-07 ENCOUNTER — Encounter: Payer: Self-pay | Admitting: Family Medicine

## 2013-01-07 DIAGNOSIS — E559 Vitamin D deficiency, unspecified: Secondary | ICD-10-CM

## 2013-01-08 NOTE — Telephone Encounter (Signed)
Tell her I put in an order for this

## 2013-01-11 ENCOUNTER — Other Ambulatory Visit: Payer: Self-pay | Admitting: Family Medicine

## 2013-01-11 ENCOUNTER — Other Ambulatory Visit (INDEPENDENT_AMBULATORY_CARE_PROVIDER_SITE_OTHER): Payer: Managed Care, Other (non HMO)

## 2013-01-11 DIAGNOSIS — E559 Vitamin D deficiency, unspecified: Secondary | ICD-10-CM

## 2013-01-16 NOTE — Progress Notes (Signed)
Quick Note:  I left voice message for pt to return my call. I also released results in my chart. ______

## 2013-01-17 MED ORDER — VITAMIN D (ERGOCALCIFEROL) 1.25 MG (50000 UNIT) PO CAPS: 50000.0000 [IU] | ORAL_CAPSULE | ORAL | Status: DC

## 2013-01-17 NOTE — Progress Notes (Signed)
Quick Note:  I spoke with pt and sent script e-scribe. ______ 

## 2013-02-01 ENCOUNTER — Other Ambulatory Visit: Payer: Self-pay

## 2013-03-12 ENCOUNTER — Other Ambulatory Visit: Payer: Self-pay | Admitting: Family Medicine

## 2013-04-12 ENCOUNTER — Encounter: Payer: Self-pay | Admitting: Family Medicine

## 2013-04-12 DIAGNOSIS — E559 Vitamin D deficiency, unspecified: Secondary | ICD-10-CM

## 2013-04-13 NOTE — Telephone Encounter (Signed)
I put in the order for the level. She just needs to set up a lab time

## 2013-04-19 ENCOUNTER — Telehealth: Payer: Self-pay | Admitting: Family Medicine

## 2013-04-19 NOTE — Telephone Encounter (Signed)
Patient Information:  Caller Name: Res[facility]  Phone: [phone]  Patient: [name] [name], [name] [name]  Gender: Female  DOB: 1970-11-18  Age: 43 Years  PCP: Alysia Penna [facility])  Pregnant: No  Office Follow Up:  Does the office need to follow up with this patient?: Yes  Instructions For The Office: Please contact patient regarding nausea and body tension after receiving Lumbar injection at Orthopedist.  RN Note:  Please contact patient regarding nausea and body tension after receiving Lumbar injection at Orthopedist.  Symptoms  Reason For Call & Symptoms: Patient was seen at her [facility] 04/17/13 and received Lumbar pain block injection with no sedation. She is not sure what they injected.  She was told to contact her Primary Care with any issues. She is complaining of ongoing  +nausea and  describes "muscle spasm and being tense; I cannot relax my body".  No shortness of breath, chest pain or numbness or tingling.  Reviewed Health History In EMR: Yes  Reviewed Medications In EMR: Yes  Reviewed Allergies In EMR: Yes  Reviewed Surgeries / Procedures: Yes  Date of Onset of Symptoms: 04/17/2013 OB / GYN:  LMP: 04/03/2013  Guideline(s) Used:  Back Pain  Disposition Per Guideline:   See Within 2 Weeks in Office  Reason For Disposition Reached:   Back pain is a chronic symptom (recurrent or ongoing AND lasting > 4 weeks)  Advice Given:  Call Back If:  Numbness or weakness occur  Bowel/bladder problems occur  You become worse.  RN Overrode Recommendation:  Document Patient  Please contact patient regarding nausea and body tension after receiving Lumbar injection at Orthopedist.

## 2013-04-19 NOTE — Telephone Encounter (Signed)
I spoke with pt and Dr. Sarajane Jews does recommend that she call the office where she actually had the injection.

## 2013-05-03 ENCOUNTER — Other Ambulatory Visit: Payer: Self-pay | Admitting: Family Medicine

## 2013-05-14 ENCOUNTER — Other Ambulatory Visit (INDEPENDENT_AMBULATORY_CARE_PROVIDER_SITE_OTHER): Payer: BC Managed Care – PPO

## 2013-05-14 DIAGNOSIS — E559 Vitamin D deficiency, unspecified: Secondary | ICD-10-CM

## 2013-05-15 ENCOUNTER — Other Ambulatory Visit: Payer: BC Managed Care – PPO

## 2013-05-15 LAB — VITAMIN D 25 HYDROXY (VIT D DEFICIENCY, FRACTURES): VIT D 25 HYDROXY: 37 ng/mL (ref 30–89)

## 2013-05-16 ENCOUNTER — Telehealth: Payer: Self-pay | Admitting: Family Medicine

## 2013-05-16 MED ORDER — VITAMIN D (ERGOCALCIFEROL) 1.25 MG (50000 UNIT) PO CAPS: 50000.0000 [IU] | ORAL_CAPSULE | ORAL | Status: DC

## 2013-05-16 NOTE — Telephone Encounter (Signed)
This is a duplicate note. See lab results note.

## 2013-05-16 NOTE — Addendum Note (Signed)
Addended by: Aggie Hacker A on: 05/16/2013 02:30 PM   Modules accepted: Orders

## 2013-05-16 NOTE — Telephone Encounter (Signed)
Pt would like to know what mg of vit d she should be on? Please call work at wk #

## 2013-05-28 ENCOUNTER — Encounter (HOSPITAL_BASED_OUTPATIENT_CLINIC_OR_DEPARTMENT_OTHER): Payer: Self-pay | Admitting: Emergency Medicine

## 2013-05-28 ENCOUNTER — Emergency Department (HOSPITAL_BASED_OUTPATIENT_CLINIC_OR_DEPARTMENT_OTHER)
Admission: EM | Admit: 2013-05-28 | Discharge: 2013-05-28 | Disposition: A | Discharge status: Home / Self Care | Payer: BC Managed Care – PPO | Attending: Emergency Medicine | Admitting: Emergency Medicine

## 2013-05-28 DIAGNOSIS — F3289 Other specified depressive episodes: Secondary | ICD-10-CM | POA: Insufficient documentation

## 2013-05-28 DIAGNOSIS — F1310 Sedative, hypnotic or anxiolytic abuse, uncomplicated: Secondary | ICD-10-CM | POA: Insufficient documentation

## 2013-05-28 DIAGNOSIS — H5710 Ocular pain, unspecified eye: Secondary | ICD-10-CM | POA: Insufficient documentation

## 2013-05-28 DIAGNOSIS — K219 Gastro-esophageal reflux disease without esophagitis: Secondary | ICD-10-CM | POA: Insufficient documentation

## 2013-05-28 DIAGNOSIS — F411 Generalized anxiety disorder: Secondary | ICD-10-CM | POA: Insufficient documentation

## 2013-05-28 DIAGNOSIS — R221 Localized swelling, mass and lump, neck: Principal | ICD-10-CM

## 2013-05-28 DIAGNOSIS — F17200 Nicotine dependence, unspecified, uncomplicated: Secondary | ICD-10-CM | POA: Insufficient documentation

## 2013-05-28 DIAGNOSIS — Z79899 Other long term (current) drug therapy: Secondary | ICD-10-CM | POA: Insufficient documentation

## 2013-05-28 DIAGNOSIS — J45909 Unspecified asthma, uncomplicated: Secondary | ICD-10-CM | POA: Insufficient documentation

## 2013-05-28 DIAGNOSIS — F329 Major depressive disorder, single episode, unspecified: Secondary | ICD-10-CM | POA: Insufficient documentation

## 2013-05-28 DIAGNOSIS — K0381 Cracked tooth: Secondary | ICD-10-CM | POA: Insufficient documentation

## 2013-05-28 DIAGNOSIS — R220 Localized swelling, mass and lump, head: Secondary | ICD-10-CM

## 2013-05-28 DIAGNOSIS — Z791 Long term (current) use of non-steroidal anti-inflammatories (NSAID): Secondary | ICD-10-CM | POA: Insufficient documentation

## 2013-05-28 DIAGNOSIS — Z8739 Personal history of other diseases of the musculoskeletal system and connective tissue: Secondary | ICD-10-CM | POA: Insufficient documentation

## 2013-05-28 DIAGNOSIS — J3489 Other specified disorders of nose and nasal sinuses: Secondary | ICD-10-CM | POA: Insufficient documentation

## 2013-05-28 MED ORDER — HYDROCODONE-ACETAMINOPHEN 5-325 MG PO TABS: 1.0000 | ORAL_TABLET | ORAL | Status: DC | PRN

## 2013-05-28 MED ORDER — CLINDAMYCIN HCL 300 MG PO CAPS: 300.0000 mg | ORAL_CAPSULE | Freq: Four times a day (QID) | ORAL | Status: DC

## 2013-05-28 NOTE — ED Provider Notes (Signed)
CSN: [id number]     Arrival date & time 05/28/13  0903 History   First MD Initiated Contact with Patient 05/28/13 [phone]     Chief Complaint  Patient presents with  . Facial Swelling     (Consider location/radiation/quality/duration/timing/severity/associated sxs/prior Treatment) HPI Comments: Pt states that she woke up this morning and she noticed some swelling to the left side of her face. Pt states that it hurts behind that eye. Denies fever. Pt states that she has a fractured tooth that needs to be fixed but hasn't seen a dentist. Pt states that she has also had congestion for the last couple of days. Denies visual changes  The history is provided by the patient. No language interpreter was used.    Past Medical History  Diagnosis Date  . ANXIETY 08/22/2007  . NICOTINE ADDICTION 06/26/2007  . DEPRESSION 08/22/2007  . ALLERGIC RHINITIS 06/26/2007  . ASTHMA 06/26/2007  . ASTHMA, WITH ACUTE EXACERBATION 07/26/2007  . HYPERGLYCEMIA 08/22/2007  . NONSPECIFIC ABNORM RESULTS THYROID FUNCT STUDY 08/22/2007  . MYALGIA 04/15/2010  . Fibromyalgia   . DM (dermatomyositis)   . Spontaneous pneumothorax   . GERD (gastroesophageal reflux disease)    Past Surgical History  Procedure Laterality Date  . Fallopian tubectomy Right 2005  . Cervical conization w/bx    . Lung surgery    . Knee surgery     History reviewed. No pertinent family history. History  Substance Use Topics  . Smoking status: Current Every Day Smoker -- 0.50 packs/day for 27 years    Types: Cigarettes  . Smokeless tobacco: Never Used  . Alcohol Use: 1.0 oz/week    2 drink(s) per week   OB History   Grav Para Term Preterm Abortions TAB SAB Ect Mult Living                 Review of Systems  Constitutional: Negative.   Respiratory: Negative.   Cardiovascular: Negative.       Allergies  Codeine  Home Medications   Current Outpatient Rx  Name  Route  Sig  Dispense  Refill  . CHANTIX 1 MG tablet      TAKE 1  TABLET BY MOUTH TWICE A DAY   60 tablet   11   . citalopram (CELEXA) 20 MG tablet      TAKE 1 TABLET BY MOUTH DAILY   30 tablet   7   . clindamycin (CLEOCIN) 300 MG capsule   Oral   Take 1 capsule (300 mg total) by mouth 4 (four) times daily.   21 capsule   0   . cyclobenzaprine (FLEXERIL) 10 MG tablet   Oral   Take 10 mg by mouth at bedtime as needed.         . diclofenac (VOLTAREN) 75 MG EC tablet      TAKE 1 TABLET (75 MG TOTAL) BY MOUTH 2 (TWO) TIMES DAILY.   60 tablet   9   . ergocalciferol (VITAMIN D2) 50000 UNITS capsule   Oral   Take 1 capsule (50,000 Units total) by mouth once a week.   12 capsule   0   . EXPIRED: fluticasone (FLONASE) 50 MCG/ACT nasal spray   Nasal   Place 2 sprays into the nose daily.   16 g   6   . HYDROcodone-acetaminophen (NORCO/VICODIN) 5-325 MG per tablet   Oral   Take 1-2 tablets by mouth every 4 (four) hours as needed.   10 tablet   0   .  hydroxychloroquine (PLAQUENIL) 200 MG tablet   Oral   Take by mouth daily.         . Lactobacillus Rhamnosus, GG, (CULTURELLE PO)   Oral   Take by mouth daily.         Marland Kitchen LORazepam (ATIVAN) 0.5 MG tablet   Oral   Take 1 tablet (0.5 mg total) by mouth every 8 (eight) hours as needed for anxiety.   90 tablet   5   . omeprazole (PRILOSEC) 40 MG capsule      TAKE 1 CAPSULE (40 MG TOTAL) BY MOUTH DAILY.   30 capsule   11   . ondansetron (ZOFRAN ODT) 8 MG disintegrating tablet      8mg  ODT q4 hours prn nausea   8 tablet   0   . varenicline (CHANTIX STARTING MONTH PAK) 0.5 MG X 11 & 1 MG X 42 tablet      Take one 0.5 mg tablet by mouth once daily for 3 days, then increase to one 0.5 mg tablet twice daily for 4 days, then increase to one 1 mg tablet twice daily.   53 tablet   0   . VENTOLIN HFA 108 (90 BASE) MCG/ACT inhaler      INHALE 2 PUFFS BY MOUTH EVERY 4 HOURS AS NEEDED   18 each   1   . Vitamin D, Ergocalciferol, (DRISDOL) 50000 UNITS CAPS capsule      TAKE 1  CAPSULE (50,000 UNITS TOTAL) BY MOUTH 2 (TWO) TIMES A WEEK.   24 capsule   0   . Vitamin D, Ergocalciferol, (DRISDOL) 50000 UNITS CAPS capsule   Oral   Take 1 capsule (50,000 Units total) by mouth every 7 (seven) days.   4 capsule   5    BP 180/97  Pulse 115  Temp(Src) 98.6 F (37 C) (Oral)  Resp 20  SpO2 99%  LMP 05/04/2013 Physical Exam  Nursing note and vitals reviewed. Constitutional: She is oriented to person, place, and time. She appears well-developed and well-nourished.  HENT:  Right Ear: External ear normal.  Left Ear: External ear normal.  Swelling to the left cheek noted. Multiple decayed upper left teeth  Eyes: Conjunctivae and EOM are normal.  Cardiovascular: Normal rate and regular rhythm.   Pulmonary/Chest: Effort normal and breath sounds normal.  Musculoskeletal: Normal range of motion.  Neurological: She is alert and oriented to person, place, and time.  Skin:  Swelling noted to the right cheek:no redness or warmth noted    ED Course  Procedures (including critical care time) Labs Review Labs Reviewed - No data to display Imaging Review No results found.   EKG Interpretation None      MDM   Final diagnoses:  Facial swelling    Concerning for abcess:no redness or warmth suggestive or cellultis   Glendell Docker, NP 05/28/13 0940

## 2013-05-28 NOTE — ED Notes (Signed)
Pt amb to room 4 with quick steady gait, pt is teary, states "I woke up with my face swollen and I'm scared something is bad wrong!" left side of face beneath left eye is swollen and tender. Pt states she has a broken tooth on the left upper area that she needs to have removed. Has not contacted dentist yet.

## 2013-05-28 NOTE — ED Provider Notes (Signed)
Medical screening examination/treatment/procedure(s) were performed by non-physician practitioner and as supervising physician I was immediately available for consultation/collaboration.   EKG Interpretation None        Ezequiel Essex, MD 05/28/13 [phone]

## 2013-05-30 ENCOUNTER — Emergency Department (HOSPITAL_BASED_OUTPATIENT_CLINIC_OR_DEPARTMENT_OTHER): Payer: BC Managed Care – PPO

## 2013-05-30 ENCOUNTER — Emergency Department (HOSPITAL_BASED_OUTPATIENT_CLINIC_OR_DEPARTMENT_OTHER)
Admission: EM | Admit: 2013-05-30 | Discharge: 2013-05-30 | Disposition: A | Discharge status: Home / Self Care | Payer: BC Managed Care – PPO | Attending: Emergency Medicine | Admitting: Emergency Medicine

## 2013-05-30 ENCOUNTER — Encounter (HOSPITAL_BASED_OUTPATIENT_CLINIC_OR_DEPARTMENT_OTHER): Payer: Self-pay | Admitting: Emergency Medicine

## 2013-05-30 DIAGNOSIS — R6889 Other general symptoms and signs: Secondary | ICD-10-CM

## 2013-05-30 DIAGNOSIS — Z79899 Other long term (current) drug therapy: Secondary | ICD-10-CM | POA: Insufficient documentation

## 2013-05-30 DIAGNOSIS — F3289 Other specified depressive episodes: Secondary | ICD-10-CM | POA: Insufficient documentation

## 2013-05-30 DIAGNOSIS — J3489 Other specified disorders of nose and nasal sinuses: Secondary | ICD-10-CM | POA: Insufficient documentation

## 2013-05-30 DIAGNOSIS — J45901 Unspecified asthma with (acute) exacerbation: Secondary | ICD-10-CM | POA: Insufficient documentation

## 2013-05-30 DIAGNOSIS — R059 Cough, unspecified: Secondary | ICD-10-CM | POA: Insufficient documentation

## 2013-05-30 DIAGNOSIS — IMO0001 Reserved for inherently not codable concepts without codable children: Secondary | ICD-10-CM | POA: Insufficient documentation

## 2013-05-30 DIAGNOSIS — F17200 Nicotine dependence, unspecified, uncomplicated: Secondary | ICD-10-CM | POA: Insufficient documentation

## 2013-05-30 DIAGNOSIS — R05 Cough: Secondary | ICD-10-CM | POA: Insufficient documentation

## 2013-05-30 DIAGNOSIS — R221 Localized swelling, mass and lump, neck: Secondary | ICD-10-CM

## 2013-05-30 DIAGNOSIS — J40 Bronchitis, not specified as acute or chronic: Secondary | ICD-10-CM

## 2013-05-30 DIAGNOSIS — K219 Gastro-esophageal reflux disease without esophagitis: Secondary | ICD-10-CM | POA: Insufficient documentation

## 2013-05-30 DIAGNOSIS — F411 Generalized anxiety disorder: Secondary | ICD-10-CM | POA: Insufficient documentation

## 2013-05-30 DIAGNOSIS — J029 Acute pharyngitis, unspecified: Secondary | ICD-10-CM | POA: Insufficient documentation

## 2013-05-30 DIAGNOSIS — Z8739 Personal history of other diseases of the musculoskeletal system and connective tissue: Secondary | ICD-10-CM | POA: Insufficient documentation

## 2013-05-30 DIAGNOSIS — Z791 Long term (current) use of non-steroidal anti-inflammatories (NSAID): Secondary | ICD-10-CM | POA: Insufficient documentation

## 2013-05-30 DIAGNOSIS — R51 Headache: Secondary | ICD-10-CM | POA: Insufficient documentation

## 2013-05-30 DIAGNOSIS — F329 Major depressive disorder, single episode, unspecified: Secondary | ICD-10-CM | POA: Insufficient documentation

## 2013-05-30 DIAGNOSIS — R220 Localized swelling, mass and lump, head: Secondary | ICD-10-CM | POA: Insufficient documentation

## 2013-05-30 HISTORY — DX: Chronic sinusitis, unspecified: J32.9

## 2013-05-30 MED ORDER — DM-GUAIFENESIN ER 30-600 MG PO TB12: 1.0000 | ORAL_TABLET | Freq: Two times a day (BID) | ORAL | Status: DC

## 2013-05-30 MED ORDER — IPRATROPIUM-ALBUTEROL 0.5-2.5 (3) MG/3ML IN SOLN
3.0000 mL | RESPIRATORY_TRACT | Status: DC
  Administered 2013-05-30: 3 mL via RESPIRATORY_TRACT
  Filled 2013-05-30: qty 3

## 2013-05-30 NOTE — ED Notes (Signed)
Pt sts she was diagnosed 2 days ago with sinusitis here. Went home with rx for abx and vicodin. Today, sts pressure and swelling in face is improved, but is now having cold chills, sweating, coughing, nasal drainage, body aches, headache.

## 2013-05-30 NOTE — Discharge Instructions (Signed)
Acute Bronchitis Bronchitis is inflammation of the airways that extend from the windpipe into the lungs (bronchi). The inflammation often causes mucus to develop. This leads to a cough, which is the most common symptom of bronchitis.  In acute bronchitis, the condition usually develops suddenly and goes away over time, usually in a couple weeks. Smoking, allergies, and asthma can make bronchitis worse. Repeated episodes of bronchitis may cause further lung problems.  CAUSES Acute bronchitis is most often caused by the same virus that causes a cold. The virus can spread from person to person (contagious).  SIGNS AND SYMPTOMS   Cough.   Fever.   Coughing up mucus.   Body aches.   Chest congestion.   Chills.   Shortness of breath.   Sore throat.  DIAGNOSIS  Acute bronchitis is usually diagnosed through a physical exam. Tests, such as chest X-rays, are sometimes done to rule out other conditions.  TREATMENT  Acute bronchitis usually goes away in a couple weeks. Often times, no medical treatment is necessary. Medicines are sometimes given for relief of fever or cough. Antibiotics are usually not needed but may be prescribed in certain situations. In some cases, an inhaler may be recommended to help reduce shortness of breath and control the cough. A cool mist vaporizer may also be used to help thin bronchial secretions and make it easier to clear the chest.  HOME CARE INSTRUCTIONS  Get plenty of rest.   Drink enough fluids to keep your urine clear or pale yellow (unless you have a medical condition that requires fluid restriction). Increasing fluids may help thin your secretions and will prevent dehydration.   Only take over-the-counter or prescription medicines as directed by your health care provider.   Avoid smoking and secondhand smoke. Exposure to cigarette smoke or irritating chemicals will make bronchitis worse. If you are a smoker, consider using nicotine gum or skin  patches to help control withdrawal symptoms. Quitting smoking will help your lungs heal faster.   Reduce the chances of another bout of acute bronchitis by washing your hands frequently, avoiding people with cold symptoms, and trying not to touch your hands to your mouth, nose, or eyes.   Follow up with your health care provider as directed.  SEEK MEDICAL CARE IF: Your symptoms do not improve after 1 week of treatment.  SEEK IMMEDIATE MEDICAL CARE IF:  You develop an increased fever or chills.   You have chest pain.   You have severe shortness of breath.  You have bloody sputum.   You develop dehydration.  You develop fainting.  You develop repeated vomiting.  You develop a severe headache. MAKE SURE YOU:   Understand these instructions.  Will watch your condition.  Will get help right away if you are not doing well or get worse. Document Released: 04/22/2004 Document Revised: 11/15/2012 Document Reviewed: 09/05/2012 [facility] Patient Information 2014 [employer].  Symptoms consistent with bronchitis upper respiratory infection  flulike symptoms. Use albuterol inhaler 2 puffs every 6 hours for the next 7 days. Continue current meds. Take Mucinex DM for cough and congestion. Work note provided. Return for any new or worse symptoms or followup with your Dr. As we discussed up for Tamiflu this and she did your prescriptions on your own I would be caused preventative and minimal benefit.

## 2013-05-30 NOTE — ED Provider Notes (Signed)
CSN: [id number]     Arrival date & time 05/30/13  1004 History   First MD Initiated Contact with Patient 05/30/13 1034     Chief Complaint  Patient presents with  . Fever     (Consider location/radiation/quality/duration/timing/severity/associated sxs/prior Treatment) Patient is a 43 y.o. female presenting with fever. The history is provided by the patient.  Fever Associated symptoms: chills, congestion, cough, headaches, myalgias and sore throat   Associated symptoms: no confusion, no dysuria, no nausea, no rash and no vomiting    patient seen here 2 days ago for left facial swelling that was pre-significant thought to be either do to sinus infection or tooth abscess patient started on clindamycin patient has shown some improvement already pains improved as needed pain medicine any more and swelling is improved but not resolved completely. Patient here now starts with upper respiratory flulike symptoms. With bodyaches coughing clear nasal congestion and drainage and mild headache. Also of mild sore throat.  Past Medical History  Diagnosis Date  . ANXIETY 08/22/2007  . NICOTINE ADDICTION 06/26/2007  . DEPRESSION 08/22/2007  . ALLERGIC RHINITIS 06/26/2007  . ASTHMA 06/26/2007  . ASTHMA, WITH ACUTE EXACERBATION 07/26/2007  . HYPERGLYCEMIA 08/22/2007  . NONSPECIFIC ABNORM RESULTS THYROID FUNCT STUDY 08/22/2007  . MYALGIA 04/15/2010  . Fibromyalgia   . DM (dermatomyositis)   . Spontaneous pneumothorax   . GERD (gastroesophageal reflux disease)   . Sinusitis    Past Surgical History  Procedure Laterality Date  . Fallopian tubectomy Right 2005  . Cervical conization w/bx    . Lung surgery    . Knee surgery     No family history on file. History  Substance Use Topics  . Smoking status: Current Some Day Smoker -- 0.50 packs/day for 27 years    Types: Cigarettes  . Smokeless tobacco: Never Used  . Alcohol Use: 1.0 oz/week    2 drink(s) per week   OB History   Grav Para Term Preterm  Abortions TAB SAB Ect Mult Living                 Review of Systems  Constitutional: Positive for chills. Negative for fever.  HENT: Positive for congestion, facial swelling and sore throat.   Eyes: Negative for pain.  Respiratory: Positive for cough and shortness of breath. Negative for wheezing.   Gastrointestinal: Negative for nausea, vomiting and abdominal pain.  Genitourinary: Negative for dysuria.  Musculoskeletal: Positive for myalgias.  Skin: Negative for rash.  Neurological: Positive for headaches.  Hematological: Does not bruise/bleed easily.  Psychiatric/Behavioral: Negative for confusion.      Allergies  Codeine  Home Medications   Current Outpatient Rx  Name  Route  Sig  Dispense  Refill  . CHANTIX 1 MG tablet      TAKE 1 TABLET BY MOUTH TWICE A DAY   60 tablet   11   . citalopram (CELEXA) 20 MG tablet      TAKE 1 TABLET BY MOUTH DAILY   30 tablet   7   . clindamycin (CLEOCIN) 300 MG capsule   Oral   Take 1 capsule (300 mg total) by mouth 4 (four) times daily.   21 capsule   0   . cyclobenzaprine (FLEXERIL) 10 MG tablet   Oral   Take 10 mg by mouth at bedtime as needed.         Marland Kitchen dextromethorphan-guaiFENesin (MUCINEX DM) 30-600 MG per 12 hr tablet   Oral   Take 1 tablet by  mouth 2 (two) times daily.   14 tablet   1   . diclofenac (VOLTAREN) 75 MG EC tablet      TAKE 1 TABLET (75 MG TOTAL) BY MOUTH 2 (TWO) TIMES DAILY.   60 tablet   9   . ergocalciferol (VITAMIN D2) 50000 UNITS capsule   Oral   Take 1 capsule (50,000 Units total) by mouth once a week.   12 capsule   0   . EXPIRED: fluticasone (FLONASE) 50 MCG/ACT nasal spray   Nasal   Place 2 sprays into the nose daily.   16 g   6   . HYDROcodone-acetaminophen (NORCO/VICODIN) 5-325 MG per tablet   Oral   Take 1-2 tablets by mouth every 4 (four) hours as needed.   10 tablet   0   . hydroxychloroquine (PLAQUENIL) 200 MG tablet   Oral   Take by mouth daily.         .  Lactobacillus Rhamnosus, GG, (CULTURELLE PO)   Oral   Take by mouth daily.         Marland Kitchen. LORazepam (ATIVAN) 0.5 MG tablet   Oral   Take 1 tablet (0.5 mg total) by mouth every 8 (eight) hours as needed for anxiety.   90 tablet   5   . omeprazole (PRILOSEC) 40 MG capsule      TAKE 1 CAPSULE (40 MG TOTAL) BY MOUTH DAILY.   30 capsule   11   . ondansetron (ZOFRAN ODT) 8 MG disintegrating tablet      8mg  ODT q4 hours prn nausea   8 tablet   0   . varenicline (CHANTIX STARTING MONTH PAK) 0.5 MG X 11 & 1 MG X 42 tablet      Take one 0.5 mg tablet by mouth once daily for 3 days, then increase to one 0.5 mg tablet twice daily for 4 days, then increase to one 1 mg tablet twice daily.   53 tablet   0   . VENTOLIN HFA 108 (90 BASE) MCG/ACT inhaler      INHALE 2 PUFFS BY MOUTH EVERY 4 HOURS AS NEEDED   18 each   1   . Vitamin D, Ergocalciferol, (DRISDOL) 50000 UNITS CAPS capsule      TAKE 1 CAPSULE (50,000 UNITS TOTAL) BY MOUTH 2 (TWO) TIMES A WEEK.   24 capsule   0   . Vitamin D, Ergocalciferol, (DRISDOL) 50000 UNITS CAPS capsule   Oral   Take 1 capsule (50,000 Units total) by mouth every 7 (seven) days.   4 capsule   5    BP 137/81  Pulse 97  Temp(Src) 98.4 F (36.9 C) (Oral)  Resp 18  Ht 5\' 7"  (1.702 m)  Wt 185 lb (83.915 kg)  BMI 28.97 kg/m2  SpO2 100%  LMP 05/30/2013 Physical Exam  Nursing note and vitals reviewed. Constitutional: She is oriented to person, place, and time. She appears well-developed and well-nourished. No distress.  HENT:  Head: Normocephalic and atraumatic.  Mouth/Throat: Oropharynx is clear and moist.  Except for swelling to the left cheek facial area. Patient states is steadily improved when she was seen 2 days ago. Some tenderness to palpation.  Eyes: Conjunctivae and EOM are normal. Pupils are equal, round, and reactive to light.  Neck: Normal range of motion.  Cardiovascular: Normal rate, regular rhythm and normal heart sounds.   No  murmur heard. Pulmonary/Chest: Effort normal and breath sounds normal. No respiratory distress. She has no wheezes. She  has no rales.  Abdominal: Soft. Bowel sounds are normal. There is no tenderness.  Musculoskeletal: Normal range of motion. She exhibits no edema.  Neurological: She is alert and oriented to person, place, and time. No cranial nerve deficit. She exhibits normal muscle tone. Coordination normal.  Skin: Skin is warm. No rash noted.    ED Course  Procedures (including critical care time) Labs Review Labs Reviewed - No data to display Imaging Review Dg Chest 2 View  05/30/2013   CLINICAL DATA:  Fever  EXAM: CHEST  2 VIEW  COMPARISON:  December 17, 2009  FINDINGS: There is a degree of underlying emphysematous change. There is minimal scarring in the right base with a slightly greater degree of scarring in the right apex. Elsewhere lungs are clear. Heart size is normal. Pulmonary vascularity reflects underlying emphysema. No adenopathy. No bone lesions.  IMPRESSION: Underlying emphysematous change with mild scarring on the right. No edema or consolidation.   Electronically Signed   By: Lowella Grip M.D.   On: 05/30/2013 10:35     EKG Interpretation None      MDM   Final diagnoses:  Flu-like symptoms  Bronchitis    From the left facial swelling on the clindamycin. Patient will continue that. Patient with new symptoms are consistent more with flulike illness upper respiratory infection or bronchitis. Patient lungs without wheezing was given a trial treatment of the duo neb here some improvement clinically no manifestations or wheezing. Patient's chest x-ray was negative for pneumonia. Will treat as a flulike or bronchitis. Patient will do albuterol inhaler for the next 7 days she has at home. Mucinex DM and continue her current medicines to include the clindamycin. Patient nontoxic no acute distress. Work note provided.    Mervin Kung, MD 05/30/13 [phone]

## 2013-06-13 ENCOUNTER — Other Ambulatory Visit: Payer: Self-pay | Admitting: Family Medicine

## 2013-06-15 NOTE — Telephone Encounter (Signed)
Call in #90 only, she needs testing and a contract

## 2013-06-21 ENCOUNTER — Other Ambulatory Visit: Payer: Self-pay | Admitting: Family Medicine

## 2013-06-21 ENCOUNTER — Encounter (HOSPITAL_BASED_OUTPATIENT_CLINIC_OR_DEPARTMENT_OTHER): Payer: Self-pay | Admitting: Emergency Medicine

## 2013-06-21 ENCOUNTER — Emergency Department (HOSPITAL_BASED_OUTPATIENT_CLINIC_OR_DEPARTMENT_OTHER)
Admission: EM | Admit: 2013-06-21 | Discharge: 2013-06-21 | Disposition: A | Discharge status: Home / Self Care | Payer: BC Managed Care – PPO | Attending: Emergency Medicine | Admitting: Emergency Medicine

## 2013-06-21 DIAGNOSIS — F411 Generalized anxiety disorder: Secondary | ICD-10-CM | POA: Insufficient documentation

## 2013-06-21 DIAGNOSIS — J45901 Unspecified asthma with (acute) exacerbation: Secondary | ICD-10-CM | POA: Insufficient documentation

## 2013-06-21 DIAGNOSIS — K219 Gastro-esophageal reflux disease without esophagitis: Secondary | ICD-10-CM | POA: Insufficient documentation

## 2013-06-21 DIAGNOSIS — Z792 Long term (current) use of antibiotics: Secondary | ICD-10-CM | POA: Insufficient documentation

## 2013-06-21 DIAGNOSIS — Z79899 Other long term (current) drug therapy: Secondary | ICD-10-CM | POA: Insufficient documentation

## 2013-06-21 DIAGNOSIS — IMO0001 Reserved for inherently not codable concepts without codable children: Secondary | ICD-10-CM | POA: Insufficient documentation

## 2013-06-21 DIAGNOSIS — E559 Vitamin D deficiency, unspecified: Secondary | ICD-10-CM | POA: Insufficient documentation

## 2013-06-21 DIAGNOSIS — F329 Major depressive disorder, single episode, unspecified: Secondary | ICD-10-CM | POA: Insufficient documentation

## 2013-06-21 DIAGNOSIS — IMO0002 Reserved for concepts with insufficient information to code with codable children: Secondary | ICD-10-CM | POA: Insufficient documentation

## 2013-06-21 DIAGNOSIS — F17200 Nicotine dependence, unspecified, uncomplicated: Secondary | ICD-10-CM | POA: Insufficient documentation

## 2013-06-21 DIAGNOSIS — F3289 Other specified depressive episodes: Secondary | ICD-10-CM | POA: Insufficient documentation

## 2013-06-21 DIAGNOSIS — Z791 Long term (current) use of non-steroidal anti-inflammatories (NSAID): Secondary | ICD-10-CM | POA: Insufficient documentation

## 2013-06-21 MED ORDER — ALBUTEROL SULFATE HFA 108 (90 BASE) MCG/ACT IN AERS
INHALATION_SPRAY | RESPIRATORY_TRACT | Status: AC
  Administered 2013-06-21: 4
  Filled 2013-06-21: qty 6.7

## 2013-06-21 NOTE — ED Notes (Signed)
States she cant breath. Speaking in complete sentences. Drove herself here. States she ran out of her medication and cant get anymore til April.

## 2013-06-21 NOTE — ED Provider Notes (Signed)
CSN: [id number]     Arrival date & time 06/21/13  2111 History   First MD Initiated Contact with Patient 06/21/13 2143     Chief Complaint  Patient presents with  . Asthma     (Consider location/radiation/quality/duration/timing/severity/associated sxs/prior Treatment) HPI Comments: Patient presents with wheezing and shortness of breath. She has a history of asthma and states she ran out of her inhaler this morning. She's been treated recently for flulike symptoms. She states she's been using her inhaler more frequently since that time. She states she did have some chest congestion initially but that's mostly cleared up. She denies any chest pain or tightness. She denies any leg pain or swelling.  Patient is a 43 y.o. female presenting with asthma.  Asthma Associated symptoms include shortness of breath. Pertinent negatives include no chest pain, no abdominal pain and no headaches.    Past Medical History  Diagnosis Date  . ANXIETY 08/22/2007  . NICOTINE ADDICTION 06/26/2007  . DEPRESSION 08/22/2007  . ALLERGIC RHINITIS 06/26/2007  . ASTHMA 06/26/2007  . ASTHMA, WITH ACUTE EXACERBATION 07/26/2007  . HYPERGLYCEMIA 08/22/2007  . NONSPECIFIC ABNORM RESULTS THYROID FUNCT STUDY 08/22/2007  . MYALGIA 04/15/2010  . Fibromyalgia   . DM (dermatomyositis)   . Spontaneous pneumothorax   . GERD (gastroesophageal reflux disease)   . Sinusitis    Past Surgical History  Procedure Laterality Date  . Fallopian tubectomy Right 2005  . Cervical conization w/bx    . Lung surgery    . Knee surgery     No family history on file. History  Substance Use Topics  . Smoking status: Current Some Day Smoker -- 0.50 packs/day for 27 years    Types: Cigarettes  . Smokeless tobacco: Never Used  . Alcohol Use: 1.0 oz/week    2 drink(s) per week   OB History   Grav Para Term Preterm Abortions TAB SAB Ect Mult Living                 Review of Systems  Constitutional: Negative for fever, chills,  diaphoresis and fatigue.  HENT: Negative for congestion, rhinorrhea and sneezing.   Eyes: Negative.   Respiratory: Positive for cough, shortness of breath and wheezing. Negative for chest tightness.   Cardiovascular: Negative for chest pain and leg swelling.  Gastrointestinal: Negative for nausea, vomiting, abdominal pain, diarrhea and blood in stool.  Genitourinary: Negative for frequency, hematuria, flank pain and difficulty urinating.  Musculoskeletal: Negative for arthralgias and back pain.  Skin: Negative for rash.  Neurological: Negative for dizziness, speech difficulty, weakness, numbness and headaches.      Allergies  Codeine  Home Medications   Current Outpatient Rx  Name  Route  Sig  Dispense  Refill  . CHANTIX 1 MG tablet      TAKE 1 TABLET BY MOUTH TWICE A DAY   60 tablet   11   . citalopram (CELEXA) 20 MG tablet      TAKE 1 TABLET BY MOUTH DAILY   30 tablet   7   . clindamycin (CLEOCIN) 300 MG capsule   Oral   Take 1 capsule (300 mg total) by mouth 4 (four) times daily.   21 capsule   0   . cyclobenzaprine (FLEXERIL) 10 MG tablet   Oral   Take 10 mg by mouth at bedtime as needed.         Marland Kitchen dextromethorphan-guaiFENesin (MUCINEX DM) 30-600 MG per 12 hr tablet   Oral   Take 1 tablet  by mouth 2 (two) times daily.   14 tablet   1   . diclofenac (VOLTAREN) 75 MG EC tablet      TAKE 1 TABLET (75 MG TOTAL) BY MOUTH 2 (TWO) TIMES DAILY.   60 tablet   9   . ergocalciferol (VITAMIN D2) 50000 UNITS capsule   Oral   Take 1 capsule (50,000 Units total) by mouth once a week.   12 capsule   0   . EXPIRED: fluticasone (FLONASE) 50 MCG/ACT nasal spray   Nasal   Place 2 sprays into the nose daily.   16 g   6   . HYDROcodone-acetaminophen (NORCO/VICODIN) 5-325 MG per tablet   Oral   Take 1-2 tablets by mouth every 4 (four) hours as needed.   10 tablet   0   . hydroxychloroquine (PLAQUENIL) 200 MG tablet   Oral   Take by mouth daily.         .  Lactobacillus Rhamnosus, GG, (CULTURELLE PO)   Oral   Take by mouth daily.         Marland Kitchen LORazepam (ATIVAN) 0.5 MG tablet      TAKE 1 TABLET EVERY 8 HOURS AS NEEDED   90 tablet   0     Patient needs to sign a contract   . omeprazole (PRILOSEC) 40 MG capsule      TAKE 1 CAPSULE (40 MG TOTAL) BY MOUTH DAILY.   30 capsule   11   . ondansetron (ZOFRAN ODT) 8 MG disintegrating tablet      8mg  ODT q4 hours prn nausea   8 tablet   0   . varenicline (CHANTIX STARTING MONTH PAK) 0.5 MG X 11 & 1 MG X 42 tablet      Take one 0.5 mg tablet by mouth once daily for 3 days, then increase to one 0.5 mg tablet twice daily for 4 days, then increase to one 1 mg tablet twice daily.   53 tablet   0   . VENTOLIN HFA 108 (90 BASE) MCG/ACT inhaler      INHALE 2 PUFFS BY MOUTH EVERY 4 HOURS AS NEEDED   18 each   1   . Vitamin D, Ergocalciferol, (DRISDOL) 50000 UNITS CAPS capsule      TAKE 1 CAPSULE (50,000 UNITS TOTAL) BY MOUTH 2 (TWO) TIMES A WEEK.   24 capsule   0   . Vitamin D, Ergocalciferol, (DRISDOL) 50000 UNITS CAPS capsule   Oral   Take 1 capsule (50,000 Units total) by mouth every 7 (seven) days.   4 capsule   5    BP 147/100  Pulse 96  Temp(Src) 97.6 F (36.4 C) (Oral)  Resp 24  Wt 185 lb (83.915 kg)  SpO2 98%  LMP 05/30/2013 Physical Exam  Constitutional: She is oriented to person, place, and time. She appears well-developed and well-nourished.  HENT:  Head: Normocephalic and atraumatic.  Eyes: Pupils are equal, round, and reactive to light.  Neck: Normal range of motion. Neck supple.  Cardiovascular: Normal rate, regular rhythm and normal heart sounds.   Pulmonary/Chest: Effort normal and breath sounds normal. No respiratory distress. She has no wheezes. She has no rales. She exhibits no tenderness.  Abdominal: Soft. Bowel sounds are normal. There is no tenderness. There is no rebound and no guarding.  Musculoskeletal: Normal range of motion. She exhibits no edema.   Lymphadenopathy:    She has no cervical adenopathy.  Neurological: She is alert and oriented to  person, place, and time.  Skin: Skin is warm and dry. No rash noted.  Psychiatric: She has a normal mood and affect.    ED Course  Procedures (including critical care time) Labs Review Labs Reviewed - No data to display Imaging Review No results found.   EKG Interpretation None      MDM   Final diagnoses:  Asthma exacerbation    Patient presents with mild asthma symptoms. She was given an albuterol MDI by respiratory therapy prior to my arrival. Her lungs are completely clear on my exam. She was discharged home with the Endo GIA and has an appointment to followup with her primary care physician next week.    Malvin Johns, MD 06/21/13 2223

## 2013-06-21 NOTE — Discharge Instructions (Signed)
Asthma, Adult Asthma is a recurring condition in which the airways tighten and narrow. Asthma can make it difficult to breathe. It can cause coughing, wheezing, and shortness of breath. Asthma episodes (also called asthma attacks) range from minor to life-threatening. Asthma cannot be cured, but medicines and lifestyle changes can help control it. CAUSES Asthma is believed to be caused by inherited (genetic) and environmental factors, but its exact cause is unknown. Asthma may be triggered by allergens, lung infections, or irritants in the air. Asthma triggers are different for each person. Common triggers include:   Animal dander.  Dust mites.  Cockroaches.  Pollen from trees or grass.  Mold.  Smoke.  Air pollutants such as dust, household cleaners, hair sprays, aerosol sprays, paint fumes, strong chemicals, or strong odors.  Cold air, weather changes, and winds (which increase molds and pollens in the air).  Strong emotional expressions such as crying or laughing hard.  Stress.  Certain medicines (such as aspirin) or types of drugs (such as beta-blockers).  Sulfites in foods and drinks. Foods and drinks that may contain sulfites include dried fruit, potato chips, and sparkling grape juice.  Infections or inflammatory conditions such as the flu, a cold, or an inflammation of the nasal membranes (rhinitis).  Gastroesophageal reflux disease (GERD).  Exercise or strenuous activity. SYMPTOMS Symptoms may occur immediately after asthma is triggered or many hours later. Symptoms include:  Wheezing.  Excessive nighttime or early morning coughing.  Frequent or severe coughing with a common cold.  Chest tightness.  Shortness of breath. DIAGNOSIS  The diagnosis of asthma is made by a review of your medical history and a physical exam. Tests may also be performed. These may include:  Lung function studies. These tests show how much air you breath in and out.  Allergy  tests.  Imaging tests such as X-rays. TREATMENT  Asthma cannot be cured, but it can usually be controlled. Treatment involves identifying and avoiding your asthma triggers. It also involves medicines. There are 2 classes of medicine used for asthma treatment:   Controller medicines. These prevent asthma symptoms from occurring. They are usually taken every day.  Reliever or rescue medicines. These quickly relieve asthma symptoms. They are used as needed and provide short-term relief. Your health care provider will help you create an asthma action plan. An asthma action plan is a written plan for managing and treating your asthma attacks. It includes a list of your asthma triggers and how they may be avoided. It also includes information on when medicines should be taken and when their dosage should be changed. An action plan may also involve the use of a device called a peak flow meter. A peak flow meter measures how well the lungs are working. It helps you monitor your condition. HOME CARE INSTRUCTIONS   Take medicine as directed by your health care provider. Speak with your health care provider if you have questions about how or when to take the medicines.  Use a peak flow meter as directed by your health care provider. Record and keep track of readings.  Understand and use the action plan to help minimize or stop an asthma attack without needing to seek medical care.  Control your home environment in the following ways to help prevent asthma attacks:  Do not smoke. Avoid being exposed to secondhand smoke.  Change your heating and air conditioning filter regularly.  Limit your use of fireplaces and wood stoves.  Get rid of pests (such as roaches and   mice) and their droppings.  Throw away plants if you see mold on them.  Clean your floors and dust regularly. Use unscented cleaning products.  Try to have someone else vacuum for you regularly. Stay out of rooms while they are being  vacuumed and for a short while afterward. If you vacuum, use a dust mask from a hardware store, a double-layered or microfilter vacuum cleaner bag, or a vacuum cleaner with a HEPA filter.  Replace carpet with wood, tile, or vinyl flooring. Carpet can trap dander and dust.  Use allergy-proof pillows, mattress covers, and box spring covers.  Wash bed sheets and blankets every week in hot water and dry them in a dryer.  Use blankets that are made of polyester or cotton.  Clean bathrooms and kitchens with bleach. If possible, have someone repaint the walls in these rooms with mold-resistant paint. Keep out of the rooms that are being cleaned and painted.  Wash hands frequently. SEEK MEDICAL CARE IF:   You have wheezing, shortness of breath, or a cough even if taking medicine to prevent attacks.  The colored mucus you cough up (sputum) is thicker than usual.  Your sputum changes from clear or white to yellow, green, gray, or bloody.  You have any problems that may be related to the medicines you are taking (such as a rash, itching, swelling, or trouble breathing).  You are using a reliever medicine more than 2 3 times per week.  Your peak flow is still at 50 79% of you personal best after following your action plan for 1 hour. SEEK IMMEDIATE MEDICAL CARE IF:   You seem to be getting worse and are unresponsive to treatment during an asthma attack.  You are short of breath even at rest.  You get short of breath when doing very little physical activity.  You have difficulty eating, drinking, or talking due to asthma symptoms.  You develop chest pain.  You develop a fast heartbeat.  You have a bluish color to your lips or fingernails.  You are lightheaded, dizzy, or faint.  Your peak flow is less than 50% of your personal best.  You have a fever or persistent symptoms for more than 2 3 days.  You have a fever and symptoms suddenly get worse. MAKE SURE YOU:   Understand these  instructions.  Will watch your condition.  Will get help right away if you are not doing well or get worse. Document Released: 03/15/2005 Document Revised: 11/15/2012 Document Reviewed: 10/12/2012 [employer] Patient Information 2014 [employer].  

## 2013-07-06 ENCOUNTER — Telehealth: Payer: Self-pay | Admitting: Family Medicine

## 2013-07-06 DIAGNOSIS — E559 Vitamin D deficiency, unspecified: Secondary | ICD-10-CM

## 2013-07-06 NOTE — Telephone Encounter (Signed)
I spoke with pt and she just needs to come by and give urine sample & sign control substance agreement. She would like for [place] to order the below labs as well. She stated that she has a history of low vitamin d level and she thinks that it is still low.

## 2013-07-06 NOTE — Telephone Encounter (Signed)
Pt is stating that when she came to pick up her 3 month supply of LORazepam (ATIVAN) 0.5 MG tablet she was told that when it was time for a refill that she would have to do bloodwork in order to get another refill.  Pt wants to know what exactly is going to be tested because she wants to get her vitamin d and anti nuclear antibodies checked as well.  Please advise.

## 2013-07-10 NOTE — Telephone Encounter (Signed)
I spoke with pt  

## 2013-07-10 NOTE — Telephone Encounter (Signed)
I did order the vitamin D level, but we will not check anti-nuclear antibodies

## 2013-07-12 ENCOUNTER — Encounter: Payer: Self-pay | Admitting: *Deleted

## 2013-07-12 ENCOUNTER — Other Ambulatory Visit (INDEPENDENT_AMBULATORY_CARE_PROVIDER_SITE_OTHER): Payer: BC Managed Care – PPO

## 2013-07-12 DIAGNOSIS — E559 Vitamin D deficiency, unspecified: Secondary | ICD-10-CM

## 2013-07-13 LAB — VITAMIN D 25 HYDROXY (VIT D DEFICIENCY, FRACTURES): Vit D, 25-Hydroxy: 37 ng/mL (ref 30–89)

## 2013-07-23 ENCOUNTER — Telehealth: Payer: Self-pay | Admitting: Family Medicine

## 2013-07-23 ENCOUNTER — Encounter: Payer: Self-pay | Admitting: Family Medicine

## 2013-07-23 ENCOUNTER — Ambulatory Visit (INDEPENDENT_AMBULATORY_CARE_PROVIDER_SITE_OTHER): Payer: BC Managed Care – PPO | Admitting: Family Medicine

## 2013-07-23 VITALS — BP 147/99 | HR 97 | Temp 98.6°F | Ht 67.0 in | Wt 182.0 lb

## 2013-07-23 DIAGNOSIS — M545 Low back pain, unspecified: Secondary | ICD-10-CM

## 2013-07-23 DIAGNOSIS — F411 Generalized anxiety disorder: Secondary | ICD-10-CM

## 2013-07-23 DIAGNOSIS — J45909 Unspecified asthma, uncomplicated: Secondary | ICD-10-CM

## 2013-07-23 NOTE — Progress Notes (Signed)
   Subjective:    Patient ID: [name] [name], female    DOB: May 23, 1970, 43 y.o.   MRN: [id number]  HPI Here for preoperative evaluation. She is scheduled to have lumbar spine surgery per Dr. Melina Schools on 08-23-13 to repair a pars defect and spondylolisthesis at the L4-5 level. Other than the back pain she has been doing well. Her asthma has been well controlled.    Review of Systems  Constitutional: Negative.   HENT: Negative.   Eyes: Negative.   Respiratory: Negative.   Cardiovascular: Negative.   Gastrointestinal: Negative.   Genitourinary: Negative for dysuria, urgency, frequency, hematuria, flank pain, decreased urine volume, enuresis, difficulty urinating, pelvic pain and dyspareunia.  Musculoskeletal: Negative.   Skin: Negative.   Neurological: Negative.   Psychiatric/Behavioral: Negative.        Objective:   Physical Exam  Constitutional: She is oriented to person, place, and time. She appears well-developed and well-nourished. No distress.  HENT:  Head: Normocephalic and atraumatic.  Right Ear: External ear normal.  Left Ear: External ear normal.  Nose: Nose normal.  Mouth/Throat: Oropharynx is clear and moist. No oropharyngeal exudate.  Eyes: Conjunctivae and EOM are normal. Pupils are equal, round, and reactive to light. No scleral icterus.  Neck: Normal range of motion. Neck supple. No JVD present. No thyromegaly present.  Cardiovascular: Normal rate, regular rhythm, normal heart sounds and intact distal pulses.  Exam reveals no gallop and no friction rub.   No murmur heard. Pulmonary/Chest: Effort normal and breath sounds normal. No respiratory distress. She has no wheezes. She has no rales. She exhibits no tenderness.  Abdominal: Soft. Bowel sounds are normal. She exhibits no distension and no mass. There is no tenderness. There is no rebound and no guarding.  Musculoskeletal: Normal range of motion. She exhibits no edema and no tenderness.  Lymphadenopathy:      She has no cervical adenopathy.  Neurological: She is alert and oriented to person, place, and time. She has normal reflexes. No cranial nerve deficit. She exhibits normal muscle tone. Coordination normal.  Skin: Skin is warm and dry. No rash noted. No erythema.  Psychiatric: She has a normal mood and affect. Her behavior is normal. Judgment and thought content normal.          Assessment & Plan:  She is doing well and she is cleared for surgery. Recheck prn

## 2013-07-23 NOTE — Telephone Encounter (Signed)
Relevant patient education assigned to patient using Emmi. ° °

## 2013-08-11 ENCOUNTER — Other Ambulatory Visit: Payer: Self-pay | Admitting: Family Medicine

## 2013-08-13 ENCOUNTER — Encounter (HOSPITAL_COMMUNITY): Payer: Self-pay | Admitting: Pharmacy Technician

## 2013-08-13 NOTE — Telephone Encounter (Signed)
Is this correct, twice a week?

## 2013-08-14 ENCOUNTER — Encounter: Payer: Self-pay | Admitting: Family Medicine

## 2013-08-14 NOTE — Telephone Encounter (Signed)
She should be taking this ONCE a week. Call in #12 with 3 rf

## 2013-08-15 ENCOUNTER — Encounter (HOSPITAL_COMMUNITY)
Admission: RE | Admit: 2013-08-15 | Discharge: 2013-08-15 | Disposition: A | Discharge status: Home / Self Care | Payer: BC Managed Care – PPO | Source: Ambulatory Visit | Attending: Orthopedic Surgery | Admitting: Orthopedic Surgery

## 2013-08-15 ENCOUNTER — Encounter (HOSPITAL_COMMUNITY): Payer: Self-pay

## 2013-08-15 DIAGNOSIS — Z01812 Encounter for preprocedural laboratory examination: Secondary | ICD-10-CM | POA: Insufficient documentation

## 2013-08-15 DIAGNOSIS — Z01810 Encounter for preprocedural cardiovascular examination: Secondary | ICD-10-CM | POA: Insufficient documentation

## 2013-08-15 LAB — SURGICAL PCR SCREEN
MRSA, PCR: POSITIVE — AB
Staphylococcus aureus: POSITIVE — AB

## 2013-08-15 LAB — BASIC METABOLIC PANEL
BUN: 9 mg/dL (ref 6–23)
CHLORIDE: 101 meq/L (ref 96–112)
CO2: 24 mEq/L (ref 19–32)
Calcium: 9.1 mg/dL (ref 8.4–10.5)
Creatinine, Ser: 0.63 mg/dL (ref 0.50–1.10)
GFR calc Af Amer: >90 mL/min (ref >90)
GFR calc non Af Amer: >90 mL/min (ref >90)
Glucose, Bld: 82 mg/dL (ref 70–99)
Potassium: 4.6 mEq/L (ref 3.7–5.3)
Sodium: 138 mEq/L (ref 137–147)

## 2013-08-15 LAB — CBC
HEMATOCRIT: 38.9 % (ref 36.0–46.0)
Hemoglobin: 13.2 g/dL (ref 12.0–15.0)
MCH: 33.2 pg (ref 26.0–34.0)
MCHC: 33.9 g/dL (ref 30.0–36.0)
MCV: 97.7 fL (ref 78.0–100.0)
Platelets: 338 10*3/uL (ref 150–400)
RBC: 3.98 MIL/uL (ref 3.87–5.11)
RDW: 13.7 % (ref 11.5–15.5)
WBC: 9.5 10*3/uL (ref 4.0–10.5)

## 2013-08-15 LAB — HCG, SERUM, QUALITATIVE: Preg, Serum: NEGATIVE

## 2013-08-15 LAB — TYPE AND SCREEN
ABO/RH(D): A POS
Antibody Screen: NEGATIVE

## 2013-08-15 LAB — ABO/RH: ABO/RH(D): A POS

## 2013-08-15 NOTE — Pre-Procedure Instructions (Signed)
[place]  08/15/2013   Your procedure is scheduled on:  08/23/13  Report to [facility] Admitting at 530 AM.  Call this number if you have problems the morning of surgery: [phone]   Remember:   Do not eat food or drink liquids after midnight.   Take these medicines the morning of surgery with A SIP OF WATER: celexa,flonase,ativan,prilosec,chantix,pain med   Do not wear jewelry, make-up or nail polish.  Do not wear lotions, powders, or perfumes. You may wear deodorant.  Do not shave 48 hours prior to surgery. Men may shave face and neck.  Do not bring valuables to the hospital.  [facility] is not responsible                  for any belongings or valuables.               Contacts, dentures or bridgework may not be worn into surgery.  Leave suitcase in the car. After surgery it may be brought to your room.  For patients admitted to the hospital, discharge time is determined by your                treatment team.               Patients discharged the day of surgery will not be allowed to drive  home.  Name and phone number of your driver: family  Special Instructions: Shower using CHG 2 nights before surgery and the night before surgery.  If you shower the day of surgery use CHG.  Use special wash - you have one bottle of CHG for all showers.  You should use approximately 1/3 of the bottle for each shower.   Please read over the following fact sheets that you were given: Pain Booklet, Coughing and Deep Breathing, Total Joint Packet and Surgical Site Infection Prevention

## 2013-08-23 ENCOUNTER — Encounter (HOSPITAL_COMMUNITY): Payer: BC Managed Care – PPO

## 2013-08-23 ENCOUNTER — Encounter (HOSPITAL_COMMUNITY): Payer: Self-pay | Admitting: Certified Registered Nurse Anesthetist

## 2013-08-23 ENCOUNTER — Inpatient Hospital Stay (HOSPITAL_COMMUNITY): Payer: BC Managed Care – PPO | Admitting: Certified Registered Nurse Anesthetist

## 2013-08-23 ENCOUNTER — Inpatient Hospital Stay (HOSPITAL_COMMUNITY)
Admission: RE | Admit: 2013-08-23 | Discharge: 2013-08-24 | DRG: 460 | Disposition: A | Discharge status: Home / Self Care | Payer: BC Managed Care – PPO | Source: Ambulatory Visit | Attending: Orthopedic Surgery | Admitting: Orthopedic Surgery

## 2013-08-23 ENCOUNTER — Inpatient Hospital Stay (HOSPITAL_COMMUNITY): Payer: BC Managed Care – PPO

## 2013-08-23 ENCOUNTER — Encounter (HOSPITAL_COMMUNITY)
Admission: RE | Disposition: A | Discharge status: Home / Self Care | Payer: Self-pay | Source: Ambulatory Visit | Attending: Orthopedic Surgery

## 2013-08-23 DIAGNOSIS — F341 Dysthymic disorder: Secondary | ICD-10-CM | POA: Diagnosis present

## 2013-08-23 DIAGNOSIS — Z823 Family history of stroke: Secondary | ICD-10-CM

## 2013-08-23 DIAGNOSIS — F17200 Nicotine dependence, unspecified, uncomplicated: Secondary | ICD-10-CM | POA: Diagnosis present

## 2013-08-23 DIAGNOSIS — Z833 Family history of diabetes mellitus: Secondary | ICD-10-CM

## 2013-08-23 DIAGNOSIS — Z885 Allergy status to narcotic agent status: Secondary | ICD-10-CM

## 2013-08-23 DIAGNOSIS — Z6379 Other stressful life events affecting family and household: Secondary | ICD-10-CM

## 2013-08-23 DIAGNOSIS — K219 Gastro-esophageal reflux disease without esophagitis: Secondary | ICD-10-CM | POA: Diagnosis present

## 2013-08-23 DIAGNOSIS — Z981 Arthrodesis status: Secondary | ICD-10-CM

## 2013-08-23 DIAGNOSIS — Z8261 Family history of arthritis: Secondary | ICD-10-CM

## 2013-08-23 DIAGNOSIS — M549 Dorsalgia, unspecified: Secondary | ICD-10-CM | POA: Diagnosis present

## 2013-08-23 DIAGNOSIS — IMO0001 Reserved for inherently not codable concepts without codable children: Secondary | ICD-10-CM | POA: Diagnosis present

## 2013-08-23 DIAGNOSIS — Z23 Encounter for immunization: Secondary | ICD-10-CM

## 2013-08-23 DIAGNOSIS — Z8249 Family history of ischemic heart disease and other diseases of the circulatory system: Secondary | ICD-10-CM

## 2013-08-23 DIAGNOSIS — M4120 Other idiopathic scoliosis, site unspecified: Secondary | ICD-10-CM | POA: Diagnosis present

## 2013-08-23 DIAGNOSIS — Q762 Congenital spondylolisthesis: Principal | ICD-10-CM

## 2013-08-23 DIAGNOSIS — J45909 Unspecified asthma, uncomplicated: Secondary | ICD-10-CM | POA: Diagnosis present

## 2013-08-23 HISTORY — PX: LUMBAR FUSION: SHX111

## 2013-08-23 SURGERY — POSTERIOR LUMBAR FUSION 1 LEVEL
Anesthesia: General | Site: Back

## 2013-08-23 MED ORDER — PROPOFOL 10 MG/ML IV BOLUS
INTRAVENOUS | Status: AC
  Filled 2013-08-23: qty 20

## 2013-08-23 MED ORDER — METHOCARBAMOL 500 MG PO TABS
500.0000 mg | ORAL_TABLET | Freq: Four times a day (QID) | ORAL | Status: DC | PRN
  Administered 2013-08-23 – 2013-08-24 (×4): 500 mg via ORAL
  Filled 2013-08-23 (×4): qty 1

## 2013-08-23 MED ORDER — FENTANYL CITRATE 0.05 MG/ML IJ SOLN
INTRAMUSCULAR | Status: AC
  Filled 2013-08-23: qty 5

## 2013-08-23 MED ORDER — CEFAZOLIN SODIUM-DEXTROSE 2-3 GM-% IV SOLR
INTRAVENOUS | Status: AC
  Administered 2013-08-23: 2 g via INTRAVENOUS
  Filled 2013-08-23: qty 50

## 2013-08-23 MED ORDER — DEXTROSE 5 % IV SOLN
500.0000 mg | Freq: Four times a day (QID) | INTRAVENOUS | Status: DC | PRN
  Filled 2013-08-23: qty 5

## 2013-08-23 MED ORDER — ROCURONIUM BROMIDE 50 MG/5ML IV SOLN
INTRAVENOUS | Status: AC
  Filled 2013-08-23: qty 1

## 2013-08-23 MED ORDER — PROPOFOL 10 MG/ML IV BOLUS
INTRAVENOUS | Status: DC | PRN
  Administered 2013-08-23: 50 mg via INTRAVENOUS
  Administered 2013-08-23: 200 mg via INTRAVENOUS

## 2013-08-23 MED ORDER — HYDROXYCHLOROQUINE SULFATE 200 MG PO TABS
200.0000 mg | ORAL_TABLET | Freq: Two times a day (BID) | ORAL | Status: DC
  Administered 2013-08-23 – 2013-08-24 (×2): 200 mg via ORAL
  Filled 2013-08-23 (×3): qty 1

## 2013-08-23 MED ORDER — CITALOPRAM HYDROBROMIDE 20 MG PO TABS
20.0000 mg | ORAL_TABLET | Freq: Every day | ORAL | Status: DC
  Administered 2013-08-24: 20 mg via ORAL
  Filled 2013-08-23 (×2): qty 1

## 2013-08-23 MED ORDER — PHENYLEPHRINE HCL 10 MG/ML IJ SOLN
10.0000 mg | INTRAVENOUS | Status: DC | PRN
  Administered 2013-08-23: 20 ug/min via INTRAVENOUS

## 2013-08-23 MED ORDER — ARTIFICIAL TEARS OP OINT
TOPICAL_OINTMENT | OPHTHALMIC | Status: DC | PRN
  Administered 2013-08-23: 1 via OPHTHALMIC

## 2013-08-23 MED ORDER — BUPIVACAINE-EPINEPHRINE 0.25% -1:200000 IJ SOLN
INTRAMUSCULAR | Status: DC | PRN
  Administered 2013-08-23: 10 mL

## 2013-08-23 MED ORDER — LORAZEPAM 0.5 MG PO TABS: 0.5000 mg | ORAL_TABLET | Freq: Three times a day (TID) | ORAL | Status: DC | PRN

## 2013-08-23 MED ORDER — DEXAMETHASONE SODIUM PHOSPHATE 4 MG/ML IJ SOLN
INTRAMUSCULAR | Status: AC
  Filled 2013-08-23: qty 2

## 2013-08-23 MED ORDER — DEXAMETHASONE 4 MG PO TABS
4.0000 mg | ORAL_TABLET | Freq: Four times a day (QID) | ORAL | Status: DC
  Administered 2013-08-23 – 2013-08-24 (×4): 4 mg via ORAL
  Filled 2013-08-23 (×7): qty 1

## 2013-08-23 MED ORDER — NALOXONE HCL 0.4 MG/ML IJ SOLN: 0.4000 mg | INTRAMUSCULAR | Status: DC | PRN

## 2013-08-23 MED ORDER — DIPHENHYDRAMINE HCL 12.5 MG/5ML PO ELIX
12.5000 mg | ORAL_SOLUTION | Freq: Four times a day (QID) | ORAL | Status: DC | PRN
  Administered 2013-08-24 (×2): 12.5 mg via ORAL
  Filled 2013-08-23 (×2): qty 10

## 2013-08-23 MED ORDER — OXYCODONE HCL 5 MG PO TABS
10.0000 mg | ORAL_TABLET | ORAL | Status: DC | PRN
  Administered 2013-08-24 (×2): 10 mg via ORAL
  Filled 2013-08-23 (×2): qty 2

## 2013-08-23 MED ORDER — ACETAMINOPHEN 10 MG/ML IV SOLN
1000.0000 mg | Freq: Four times a day (QID) | INTRAVENOUS | Status: DC
  Administered 2013-08-23 – 2013-08-24 (×3): 1000 mg via INTRAVENOUS
  Filled 2013-08-23 (×4): qty 100

## 2013-08-23 MED ORDER — DIPHENHYDRAMINE HCL 50 MG/ML IJ SOLN: 12.5000 mg | Freq: Four times a day (QID) | INTRAMUSCULAR | Status: DC | PRN

## 2013-08-23 MED ORDER — LIDOCAINE HCL (CARDIAC) 20 MG/ML IV SOLN
INTRAVENOUS | Status: AC
Start: 2013-08-23 — End: 2013-08-23
  Filled 2013-08-23: qty 5

## 2013-08-23 MED ORDER — PNEUMOCOCCAL VAC POLYVALENT 25 MCG/0.5ML IJ INJ
0.5000 mL | INJECTION | INTRAMUSCULAR | Status: AC
Start: 2013-08-24 — End: 2013-08-24
  Administered 2013-08-24: 0.5 mL via INTRAMUSCULAR
  Filled 2013-08-23: qty 0.5

## 2013-08-23 MED ORDER — MENTHOL 3 MG MT LOZG: 1.0000 | LOZENGE | OROMUCOSAL | Status: DC | PRN

## 2013-08-23 MED ORDER — ONDANSETRON HCL 4 MG/2ML IJ SOLN
INTRAMUSCULAR | Status: AC
  Filled 2013-08-23: qty 2

## 2013-08-23 MED ORDER — FLUTICASONE PROPIONATE 50 MCG/ACT NA SUSP
1.0000 | Freq: Every day | NASAL | Status: DC
  Filled 2013-08-23: qty 16

## 2013-08-23 MED ORDER — DEXAMETHASONE SODIUM PHOSPHATE 4 MG/ML IJ SOLN
4.0000 mg | Freq: Four times a day (QID) | INTRAMUSCULAR | Status: DC
  Filled 2013-08-23 (×7): qty 1

## 2013-08-23 MED ORDER — HEMOSTATIC AGENTS (NO CHARGE) OPTIME
TOPICAL | Status: DC | PRN
  Administered 2013-08-23: 1 via TOPICAL

## 2013-08-23 MED ORDER — HYDROMORPHONE HCL PF 1 MG/ML IJ SOLN
0.2500 mg | INTRAMUSCULAR | Status: DC | PRN
  Administered 2013-08-23 (×2): 0.5 mg via INTRAVENOUS

## 2013-08-23 MED ORDER — ONDANSETRON HCL 4 MG/2ML IJ SOLN
INTRAMUSCULAR | Status: DC | PRN
  Administered 2013-08-23: 4 mg via INTRAVENOUS

## 2013-08-23 MED ORDER — PHENOL 1.4 % MT LIQD: 1.0000 | OROMUCOSAL | Status: DC | PRN

## 2013-08-23 MED ORDER — CEFAZOLIN SODIUM 1-5 GM-% IV SOLN
1.0000 g | Freq: Three times a day (TID) | INTRAVENOUS | Status: AC
  Administered 2013-08-23 – 2013-08-24 (×2): 1 g via INTRAVENOUS
  Filled 2013-08-23 (×2): qty 50

## 2013-08-23 MED ORDER — PROPOFOL 10 MG/ML IV EMUL
INTRAVENOUS | Status: AC
  Filled 2013-08-23: qty 100

## 2013-08-23 MED ORDER — ALBUMIN HUMAN 5 % IV SOLN
INTRAVENOUS | Status: DC | PRN
  Administered 2013-08-23: 10:00:00 via INTRAVENOUS

## 2013-08-23 MED ORDER — ARTIFICIAL TEARS OP OINT
TOPICAL_OINTMENT | OPHTHALMIC | Status: AC
  Filled 2013-08-23: qty 3.5

## 2013-08-23 MED ORDER — OXYCODONE HCL 5 MG PO TABS: 5.0000 mg | ORAL_TABLET | Freq: Once | ORAL | Status: DC | PRN

## 2013-08-23 MED ORDER — SODIUM CHLORIDE 0.9 % IV SOLN: 250.0000 mL | INTRAVENOUS | Status: DC

## 2013-08-23 MED ORDER — EPHEDRINE SULFATE 50 MG/ML IJ SOLN
INTRAMUSCULAR | Status: AC
  Filled 2013-08-23: qty 1

## 2013-08-23 MED ORDER — OXYCODONE HCL 5 MG/5ML PO SOLN: 5.0000 mg | Freq: Once | ORAL | Status: DC | PRN

## 2013-08-23 MED ORDER — SUCCINYLCHOLINE CHLORIDE 20 MG/ML IJ SOLN
INTRAMUSCULAR | Status: DC | PRN
  Administered 2013-08-23: 100 mg via INTRAVENOUS

## 2013-08-23 MED ORDER — HYDROMORPHONE 0.3 MG/ML IV SOLN
INTRAVENOUS | Status: AC
  Filled 2013-08-23: qty 25

## 2013-08-23 MED ORDER — ACETAMINOPHEN 10 MG/ML IV SOLN
INTRAVENOUS | Status: AC
  Administered 2013-08-23: 1000 mg via INTRAVENOUS
  Filled 2013-08-23: qty 100

## 2013-08-23 MED ORDER — ONDANSETRON HCL 4 MG/2ML IJ SOLN: 4.0000 mg | Freq: Four times a day (QID) | INTRAMUSCULAR | Status: DC | PRN

## 2013-08-23 MED ORDER — HYDROMORPHONE 0.3 MG/ML IV SOLN
INTRAVENOUS | Status: DC
  Administered 2013-08-23: 0.7999 mg via INTRAVENOUS
  Administered 2013-08-23: 1.39 mg via INTRAVENOUS
  Administered 2013-08-23: 13:00:00 via INTRAVENOUS
  Administered 2013-08-24: 3.99 mg via INTRAVENOUS
  Administered 2013-08-24: 02:00:00 via INTRAVENOUS
  Administered 2013-08-24: 2.79 mg via INTRAVENOUS
  Administered 2013-08-24: 1.79 mg via INTRAVENOUS
  Filled 2013-08-23: qty 25

## 2013-08-23 MED ORDER — LACTATED RINGERS IV SOLN
INTRAVENOUS | Status: DC | PRN
  Administered 2013-08-23 (×3): via INTRAVENOUS

## 2013-08-23 MED ORDER — HYDROMORPHONE HCL PF 1 MG/ML IJ SOLN
INTRAMUSCULAR | Status: AC
  Administered 2013-08-23: 0.5 mg via INTRAVENOUS
  Filled 2013-08-23: qty 1

## 2013-08-23 MED ORDER — PHENYLEPHRINE HCL 10 MG/ML IJ SOLN
INTRAMUSCULAR | Status: DC | PRN
  Administered 2013-08-23 (×5): 40 ug via INTRAVENOUS
  Administered 2013-08-23: 80 ug via INTRAVENOUS
  Administered 2013-08-23 (×3): 40 ug via INTRAVENOUS

## 2013-08-23 MED ORDER — ONDANSETRON HCL 4 MG/2ML IJ SOLN: 4.0000 mg | INTRAMUSCULAR | Status: DC | PRN

## 2013-08-23 MED ORDER — LACTATED RINGERS IV SOLN
INTRAVENOUS | Status: DC
  Administered 2013-08-23: 18:00:00 via INTRAVENOUS

## 2013-08-23 MED ORDER — MIDAZOLAM HCL 2 MG/2ML IJ SOLN
INTRAMUSCULAR | Status: AC
  Filled 2013-08-23: qty 2

## 2013-08-23 MED ORDER — SODIUM CHLORIDE 0.9 % IJ SOLN
3.0000 mL | Freq: Two times a day (BID) | INTRAMUSCULAR | Status: DC
  Administered 2013-08-24: 3 mL via INTRAVENOUS

## 2013-08-23 MED ORDER — LIDOCAINE HCL (CARDIAC) 20 MG/ML IV SOLN
INTRAVENOUS | Status: DC | PRN
Start: 2013-08-23 — End: 2013-08-23
  Administered 2013-08-23: 80 mg via INTRAVENOUS

## 2013-08-23 MED ORDER — SODIUM CHLORIDE 0.9 % IJ SOLN
3.0000 mL | INTRAMUSCULAR | Status: DC | PRN
Start: 2013-08-23 — End: 2013-08-24

## 2013-08-23 MED ORDER — GELATIN ABSORBABLE MT POWD
OROMUCOSAL | Status: DC | PRN
  Administered 2013-08-23: 08:00:00 via TOPICAL

## 2013-08-23 MED ORDER — BUPIVACAINE-EPINEPHRINE (PF) 0.25% -1:200000 IJ SOLN
INTRAMUSCULAR | Status: AC
  Filled 2013-08-23: qty 30

## 2013-08-23 MED ORDER — THROMBIN 20000 UNITS EX SOLR
CUTANEOUS | Status: AC
  Filled 2013-08-23: qty 20000

## 2013-08-23 MED ORDER — STERILE WATER FOR INJECTION IJ SOLN
INTRAMUSCULAR | Status: AC
  Filled 2013-08-23: qty 10

## 2013-08-23 MED ORDER — MIDAZOLAM HCL 5 MG/5ML IJ SOLN
INTRAMUSCULAR | Status: DC | PRN
  Administered 2013-08-23: 2 mg via INTRAVENOUS

## 2013-08-23 MED ORDER — ALBUTEROL SULFATE (2.5 MG/3ML) 0.083% IN NEBU: 3.0000 mL | INHALATION_SOLUTION | Freq: Four times a day (QID) | RESPIRATORY_TRACT | Status: DC | PRN

## 2013-08-23 MED ORDER — SODIUM CHLORIDE 0.9 % IJ SOLN: 9.0000 mL | INTRAMUSCULAR | Status: DC | PRN

## 2013-08-23 MED ORDER — FENTANYL CITRATE 0.05 MG/ML IJ SOLN
INTRAMUSCULAR | Status: DC | PRN
  Administered 2013-08-23 (×5): 50 ug via INTRAVENOUS
  Administered 2013-08-23: 150 ug via INTRAVENOUS
  Administered 2013-08-23 (×2): 50 ug via INTRAVENOUS

## 2013-08-23 MED ORDER — DEXAMETHASONE SODIUM PHOSPHATE 4 MG/ML IJ SOLN
INTRAMUSCULAR | Status: DC | PRN
  Administered 2013-08-23: 8 mg via INTRAVENOUS

## 2013-08-23 MED ORDER — SUCCINYLCHOLINE CHLORIDE 20 MG/ML IJ SOLN
INTRAMUSCULAR | Status: AC
  Filled 2013-08-23: qty 1

## 2013-08-23 SURGICAL SUPPLY — 78 items
BLADE SURG ROTATE 9660 (MISCELLANEOUS) IMPLANT
BUR EGG ELITE 4.0 (BURR) IMPLANT
BUR EGG ELITE 4.0MM (BURR)
CLIP NEUROVISION LG (CLIP) ×2 IMPLANT
CLOSURE STERI-STRIP 1/2X4 (GAUZE/BANDAGES/DRESSINGS) ×1
CLSR STERI-STRIP ANTIMIC 1/2X4 (GAUZE/BANDAGES/DRESSINGS) ×2 IMPLANT
CORDS BIPOLAR (ELECTRODE) ×3 IMPLANT
COVER MAYO STAND STRL (DRAPES) ×6 IMPLANT
COVER SURGICAL LIGHT HANDLE (MISCELLANEOUS) ×3 IMPLANT
DRAPE C-ARM 42X72 X-RAY (DRAPES) ×3 IMPLANT
DRAPE C-ARMOR (DRAPES) ×3 IMPLANT
DRAPE ORTHO SPLIT 77X108 STRL (DRAPES) ×3
DRAPE POUCH INSTRU U-SHP 10X18 (DRAPES) ×3 IMPLANT
DRAPE SURG 17X23 STRL (DRAPES) ×3 IMPLANT
DRAPE SURG ORHT 6 SPLT 77X108 (DRAPES) ×1 IMPLANT
DRAPE U-SHAPE 47X51 STRL (DRAPES) ×3 IMPLANT
DRSG MEPILEX BORDER 4X8 (GAUZE/BANDAGES/DRESSINGS) ×3 IMPLANT
DURAPREP 26ML APPLICATOR (WOUND CARE) ×3 IMPLANT
ELECT BLADE 4.0 EZ CLEAN MEGAD (MISCELLANEOUS) ×3
ELECT BLADE 6.5 EXT (BLADE) IMPLANT
ELECT PENCIL ROCKER SW 15FT (MISCELLANEOUS) ×3 IMPLANT
ELECT REM PT RETURN 9FT ADLT (ELECTROSURGICAL) ×3
ELECTRODE BLDE 4.0 EZ CLN MEGD (MISCELLANEOUS) ×1 IMPLANT
ELECTRODE REM PT RTRN 9FT ADLT (ELECTROSURGICAL) ×1 IMPLANT
GLOVE BIOGEL PI IND STRL 8 (GLOVE) ×1 IMPLANT
GLOVE BIOGEL PI IND STRL 8.5 (GLOVE) ×1 IMPLANT
GLOVE BIOGEL PI INDICATOR 8 (GLOVE) ×2
GLOVE BIOGEL PI INDICATOR 8.5 (GLOVE) ×2
GLOVE ECLIPSE 8.5 STRL (GLOVE) ×3 IMPLANT
GLOVE ORTHO TXT STRL SZ7.5 (GLOVE) ×3 IMPLANT
GOWN STRL REUS W/ TWL LRG LVL3 (GOWN DISPOSABLE) ×1 IMPLANT
GOWN STRL REUS W/TWL 2XL LVL3 (GOWN DISPOSABLE) ×6 IMPLANT
GOWN STRL REUS W/TWL LRG LVL3 (GOWN DISPOSABLE) ×3
GUIDEWIRE NITINOL BEVEL TIP (WIRE) ×8 IMPLANT
KIT BASIN OR (CUSTOM PROCEDURE TRAY) ×3 IMPLANT
KIT NDL NVM5 EMG ELECT (KITS) IMPLANT
KIT NEEDLE NVM5 EMG ELECT (KITS) ×1 IMPLANT
KIT NEEDLE NVM5 EMG ELECTRODE (KITS) ×2
KIT POSITION SURG JACKSON T1 (MISCELLANEOUS) ×3 IMPLANT
KIT ROOM TURNOVER OR (KITS) ×3 IMPLANT
LIGHT SOURCE ANGLE TIP STR 7FT (MISCELLANEOUS) ×2 IMPLANT
MAS TLIF HOOP SHIM (KITS) ×2 IMPLANT
NDL I-PASS III (NEEDLE) IMPLANT
NDL SPNL 18GX3.5 QUINCKE PK (NEEDLE) ×2 IMPLANT
NEEDLE 22X1 1/2 (OR ONLY) (NEEDLE) ×3 IMPLANT
NEEDLE I-PASS III (NEEDLE) ×3 IMPLANT
NEEDLE SPNL 18GX3.5 QUINCKE PK (NEEDLE) ×6 IMPLANT
NS IRRIG 1000ML POUR BTL (IV SOLUTION) ×3 IMPLANT
PACK LAMINECTOMY ORTHO (CUSTOM PROCEDURE TRAY) ×3 IMPLANT
PACK UNIVERSAL I (CUSTOM PROCEDURE TRAY) ×3 IMPLANT
PAD ARMBOARD 7.5X6 YLW CONV (MISCELLANEOUS) ×6 IMPLANT
PATTIES SURGICAL .5 X.5 (GAUZE/BANDAGES/DRESSINGS) IMPLANT
PATTIES SURGICAL .5 X1 (DISPOSABLE) ×3 IMPLANT
PRECEPT SHANK 6.5X45 (Neuro Prosthesis/Implant) ×4 IMPLANT
PRECEPT TULIPS (Neuro Prosthesis/Implant) ×4 IMPLANT
PROBE BALL TIP NVM5 SNG USE (BALLOONS) ×2 IMPLANT
PUTTY DBX 1CC (Putty) ×3 IMPLANT
PUTTY DBX 1CC DEPUY (Putty) IMPLANT
ROD 45MM (Rod) ×4 IMPLANT
SCREW PRECEPT 6.5X45 (Screw) ×4 IMPLANT
SCREW PRECEPT SET (Screw) ×8 IMPLANT
SHEET CONFORM 45LX20WX5H (Bone Implant) ×2 IMPLANT
SPONGE LAP 4X18 X RAY DECT (DISPOSABLE) ×6 IMPLANT
SPONGE SURGIFOAM ABS GEL 100 (HEMOSTASIS) ×3 IMPLANT
SPONGE SURGIFOAM ABS GEL SZ50 (HEMOSTASIS) ×2 IMPLANT
SURGIFLO TRUKIT (HEMOSTASIS) IMPLANT
SUT MON AB 3-0 SH 27 (SUTURE) ×6
SUT MON AB 3-0 SH27 (SUTURE) ×2 IMPLANT
SUT VIC AB 1 CTX 18 (SUTURE) ×3 IMPLANT
SUT VIC AB 2-0 CT1 18 (SUTURE) ×3 IMPLANT
SYR BULB IRRIGATION 50ML (SYRINGE) ×3 IMPLANT
SYR CONTROL 10ML LL (SYRINGE) ×3 IMPLANT
TLIF XLRG 11MM (Neuro Prosthesis/Implant) ×2 IMPLANT
TOWEL OR 17X24 6PK STRL BLUE (TOWEL DISPOSABLE) ×3 IMPLANT
TOWEL OR 17X26 10 PK STRL BLUE (TOWEL DISPOSABLE) ×3 IMPLANT
TRAY FOLEY CATH 16FRSI W/METER (SET/KITS/TRAYS/PACK) ×3 IMPLANT
WATER STERILE IRR 1000ML POUR (IV SOLUTION) ×3 IMPLANT
YANKAUER SUCT BULB TIP NO VENT (SUCTIONS) ×3 IMPLANT

## 2013-08-23 NOTE — Significant Event (Signed)
Received patient from PACU, report from [facility].  Patient Alert and oriented, no complaints of pain at this time, PCA on no doses received at this time.  VS remain stable at time of transfer. Patient oriented to room, call bell placed within reach, instructed patient not to get OOB without calling.  Back brace at bedside, foley cath in place draining clear yellow urine.  Dressing intact to lower bed, no drainage noted.  Patients family at bedside with patient.  Will continue to monitor closely.

## 2013-08-23 NOTE — Progress Notes (Signed)
Dr. Rolena Infante office called and they will page PA on call to see if we can get order for consent.

## 2013-08-23 NOTE — Progress Notes (Signed)
Pt states she is missing a tooth in right front that was there when she arrived to hospital tooth there is chipped off states she had a filling there at one time..crna sarah turner notifed (Sonya rn at or front desk will notify here so she can come visit pt

## 2013-08-23 NOTE — Anesthesia Procedure Notes (Signed)
Procedure Name: Intubation Date/Time: 08/23/2013 7:36 AM Performed by: Jacob Moores Pre-anesthesia Checklist: Patient identified, Emergency Drugs available, Patient being monitored and Suction available Patient Re-evaluated:Patient Re-evaluated prior to inductionOxygen Delivery Method: Circle system utilized Preoxygenation: Pre-oxygenation with 100% oxygen Intubation Type: IV induction Ventilation: Mask ventilation without difficulty Laryngoscope Size: Miller and 2 Grade View: Grade II Tube type: Oral Tube size: 7.5 mm Number of attempts: 1 Airway Equipment and Method: Stylet Placement Confirmation: ETT inserted through vocal cords under direct vision,  positive ETCO2 and breath sounds checked- equal and bilateral Secured at: 22 cm Tube secured with: Tape Dental Injury: Teeth and Oropharynx as per pre-operative assessment

## 2013-08-23 NOTE — Anesthesia Preprocedure Evaluation (Addendum)
Anesthesia Evaluation  Patient identified by MRN, date of birth, ID band Patient awake    Reviewed: Allergy & Precautions, H&P , NPO status , Patient's Chart, lab work & pertinent test results  Airway Mallampati: I TM Distance: >3 FB Neck ROM: Full    Dental  (+) Dental Advisory Given, Poor Dentition, Missing,  One of front right teeth appears to be cracked, missing tooth beside it. Poor dentition.:   Pulmonary asthma , Current Smoker,          Cardiovascular Rhythm:Regular Rate:Normal     Neuro/Psych PSYCHIATRIC DISORDERS Anxiety Depression    GI/Hepatic GERD-  Medicated,  Endo/Other  Morbid obesity  Renal/GU      Musculoskeletal  (+) Fibromyalgia -, narcotic dependent  Abdominal (+) + obese,   Peds  Hematology   Anesthesia Other Findings   Reproductive/Obstetrics                        Anesthesia Physical Anesthesia Plan  ASA: II  Anesthesia Plan: General   Post-op Pain Management:    Induction: Intravenous  Airway Management Planned: Oral ETT  Additional Equipment:   Intra-op Plan:   Post-operative Plan: Extubation in OR  Informed Consent: I have reviewed the patients History and Physical, chart, labs and discussed the procedure including the risks, benefits and alternatives for the proposed anesthesia with the patient or authorized representative who has indicated his/her understanding and acceptance.   Dental advisory given  Plan Discussed with: CRNA, Anesthesiologist and Surgeon  Anesthesia Plan Comments:        Anesthesia Quick Evaluation

## 2013-08-23 NOTE — H&P (Signed)
History of Present Illness  The patient is a 43 year old female who comes in today for a preoperative History and Physical. The patient is scheduled for a TLIF L4-5 to be performed by Dr. Duane Lope D. Rolena Infante, MD at [facility] on 05.28.2015 . Please see the hospital record for complete dictated history and physical.  The patient is being followed for their low back back pain. They are now from flare up. Symptoms reported today include: pain, aching, leg pain (right to foot, left down to the knee) and foot pain, while the patient does not report symptoms of: weakness or numbness. The patient states that they are doing poorly. The following medication has been used for pain control: Tylenol. The patient reports their current pain level to be 8 / 10.    Allergies Codeine Sulfate *ANALGESICS - OPIOID* Pollen Extracts *ALTERNATIVE MEDICINES*    Family History Diabetes Mellitus. mother, sister and grandmother mothers side Cerebrovascular Accident. grandmother mothers side Severe allergy. First Degree Relatives. sister Drug / Alcohol Addiction. grandfather fathers side Hypertension. sister and grandmother mothers side Heart Disease. grandmother mothers side Rheumatoid Arthritis. grandmother mothers side    Social History Most recent primary occupation. Cytogeneticist Previously in rehab. no Tobacco / smoke exposure. yes Marital status. divorced Tobacco use. Current every day smoker. current every day smoker; smoke(d) 3 or more pack(s) per day Number of flights of stairs before winded. less than 1 Pain Contract. no Living situation. live alone Drug/Alcohol Rehab (Currently). no Exercise. Exercises rarely Illicit drug use. no Alcohol use. current drinker; drinks wine; 8-14 per week Children. 0 Current work status. working full time    Medication History Vitamin D (50000UNIT Capsule, 1 Oral once a week)  Active. Flexeril (5MG  Tablet, 1 Oral three times daily, as needed) Active. LORazepam (0.5MG  Tablet, 1 Oral four times daily, as needed) Active. PriLOSEC (20MG  Capsule DR, 1 Oral daily) Active. Acetaminophen (325MG  Capsule, Oral) Active. Probiotic ( Oral) Active. Amoxicillin ( Oral) Specific dose unknown - Active. Plaquenil (200MG  Tablet, Oral) Active. Medications Reconciled.    Vitals 08/14/2013 10:03 AM Weight: 190 lb Height: 67 in Body Surface Area: 2.02 m Body Mass Index: 29.76 kg/m Temp.: 98.1 FPulse: 89 (Regular) BP: 156/92 (Sitting, Left Arm, Standard)   Exam On exam she is a pleasant woman who appears younger than her stated age. She is alert. She is oriented times three. No shortness of breath or chest pain. The abdomen is soft, nontender. She has intact peripheral pulses in the lower extremity. She has negative Babinski test, no clonus, 2+ symmetrical deep tendon reflexes. She has significant back pain with palpation and range of motion especially extension of the spine.    At this point in time the patient's x-rays taken in my office demonstrate a grade 1, borderline 2 spondylolisthesis. There is also a slight scoliosis as well localized to that 4-5 level.      Assessment & Plan  Risks of surgery include, but are not limited to: Death, stroke, paralysis, nerve root damage/injury, bleeding, blood clots, loss of bowel/bladder control, sexual dysfunction, retrograde ejaculation, hardware failure, or malposition, spinal fluid leak, adjacent segment disease, non-union, need for further surgery, ongoing or worse pain, injury to bladder, bowel and abdominal contents, infection and recurrent disc herniation   At this point in time, based on her March 12, 2013 exam, her symptoms seemed to have progressed. She now has numbness and dysesthesias, significant increased pain and her overall quality of life and function has  deteriorated. I have been taking care of  her since July 2014 and she has steadily and continually decompensated. At this point in time, she would like to proceed with a more permanent solution (surgical) for her pain. She has a spondylolisthesis grade II at L4/5 that I think is the major source of her pain. It does produce some lateral recess stenosis. At this point in time, having had injection therapy, formulized physical therapy and self-directed exercise therapy and having been taking both narcotic and non-narcotic medications and having problems persistent for over a year now, I think she meets the criteria for surgical intervention.    We have talked about smoking cessation and she has made every efffort to diminish and decrease, and she is rapidly approaching quitting her cigarette smoking. We are going to get a new MRI of her back and as long as there are no new significant findings, we will plan on doing a transforaminal lumbar interbody fusion at L4/5. This will allow me to directly decompress the nerve on the left side of L4 and L5 to address the sensory deficits and the pain that she is having with respect to her leg. It will allow me to stabilize and remove the painful disc at L4/5 and thereby decrease her back pain. The goal of surgery is reduction, not elimination in pain, and an improvement in quality of life. The risks include infection, bleeding, nerve damage, death, stroke, paralysis, failure to heal, need for further surgery, ongoing or worse pain, loss in bowel and bladder control, blood clots, nonunion, adjacent segment disease, hardware failure. All of her questions were addressed.

## 2013-08-23 NOTE — Transfer of Care (Addendum)
Immediate Anesthesia Transfer of Care Note  Patient: [name] [name]  Procedure(s) Performed: Procedure(s): TRANSFORAMINAL LUMBAR INTERBODY FUSION (TLIF) WITH PEDICLE SCREW FIXATION L4 - L5 1 LEVEL (N/A)  Patient Location: PACU  Anesthesia Type:General  Level of Consciousness: awake and alert   Airway & Oxygen Therapy: Patient Spontanous Breathing and Patient connected to nasal cannula oxygen  Post-op Assessment: Report given to PACU RN, Post -op Vital signs reviewed and stable and Patient moving all extremities X 4  Post vital signs: Reviewed and stable  Complications: Upon extubation, part of patient's front right tooth became dislodged. Same tooth that was noted to be cracked preoperatively. MD notified.

## 2013-08-23 NOTE — Brief Op Note (Signed)
08/23/2013  12:10 PM  PATIENT:  [name] [name]  43 y.o. female  PRE-OPERATIVE DIAGNOSIS:  grade 2 Slip L4 -L5  POST-OPERATIVE DIAGNOSIS:  grade 2 Slip L4 -L5  PROCEDURE:  Procedure(s): TRANSFORAMINAL LUMBAR INTERBODY FUSION (TLIF) WITH PEDICLE SCREW FIXATION L4 - L5 1 LEVEL (N/A)  SURGEON:  Surgeon(s) and Role:    * Melina Schools, MD - Primary  PHYSICIAN ASSISTANT:   ASSISTANTS: Benjiman Core    ANESTHESIA:   general  EBL:  Total I/O In: 2750 [I.V.:2500; IV Piggyback:250] Out: 530 [Urine:155; Blood:300]  BLOOD ADMINISTERED:none  DRAINS: none   LOCAL MEDICATIONS USED:  MARCAINE     SPECIMEN:  No Specimen  DISPOSITION OF SPECIMEN:  N/A  COUNTS:  YES  TOURNIQUET:  * No tourniquets in log *  DICTATION: .Other Dictation: Dictation Number [id number]  PLAN OF CARE: Admit to inpatient   PATIENT DISPOSITION:  PACU - hemodynamically stable.

## 2013-08-23 NOTE — Progress Notes (Signed)
Attempted to call Dr. Rolena Infante cell phone and beeper to obtain order for consent. Recording stating numbers are not in service for both numbers.

## 2013-08-23 NOTE — Anesthesia Postprocedure Evaluation (Signed)
  Anesthesia Post-op Note  Patient: [name] [name]  Procedure(s) Performed: Procedure(s): TRANSFORAMINAL LUMBAR INTERBODY FUSION (TLIF) WITH PEDICLE SCREW FIXATION L4 - L5 1 LEVEL (N/A)  Patient Location: PACU  Anesthesia Type:General  Level of Consciousness: awake and alert   Airway and Oxygen Therapy: Patient Spontanous Breathing  Post-op Pain: mild  Post-op Assessment: Post-op Vital signs reviewed and Patient's Cardiovascular Status Stable  Post-op Vital Signs: stable  Last Vitals:  Filed Vitals:   08/23/13 1315  BP: 143/95  Pulse: 94  Temp:   Resp: 14    Complications: Portion of upper right tooth chipped , this tooth noted to have crack preop.

## 2013-08-23 NOTE — Progress Notes (Signed)
Report given to Jaynie Crumble rn as cargiver

## 2013-08-24 ENCOUNTER — Inpatient Hospital Stay (HOSPITAL_COMMUNITY): Payer: BC Managed Care – PPO

## 2013-08-24 MED ORDER — POLYETHYLENE GLYCOL 3350 17 G PO PACK: 17.0000 g | PACK | Freq: Every day | ORAL | Status: DC

## 2013-08-24 MED ORDER — METHOCARBAMOL 500 MG PO TABS: 500.0000 mg | ORAL_TABLET | Freq: Four times a day (QID) | ORAL | Status: DC | PRN

## 2013-08-24 MED ORDER — PANTOPRAZOLE SODIUM 40 MG PO TBEC
80.0000 mg | DELAYED_RELEASE_TABLET | Freq: Every day | ORAL | Status: DC
  Administered 2013-08-24: 80 mg via ORAL
  Filled 2013-08-24: qty 2

## 2013-08-24 MED ORDER — VARENICLINE TARTRATE 1 MG PO TABS
1.0000 mg | ORAL_TABLET | Freq: Two times a day (BID) | ORAL | Status: DC
  Filled 2013-08-24 (×3): qty 1

## 2013-08-24 MED ORDER — DOCUSATE SODIUM 100 MG PO CAPS: 100.0000 mg | ORAL_CAPSULE | Freq: Two times a day (BID) | ORAL | Status: DC

## 2013-08-24 MED ORDER — ONDANSETRON 4 MG PO TBDP: 4.0000 mg | ORAL_TABLET | Freq: Three times a day (TID) | ORAL | Status: DC | PRN

## 2013-08-24 MED ORDER — HYDROCODONE-ACETAMINOPHEN 10-325 MG PO TABS: 1.0000 | ORAL_TABLET | ORAL | Status: DC | PRN

## 2013-08-24 MED ORDER — OXYCODONE-ACETAMINOPHEN 10-325 MG PO TABS: 1.0000 | ORAL_TABLET | ORAL | Status: DC | PRN

## 2013-08-24 NOTE — Progress Notes (Signed)
Patient ID: [name] [name], female   DOB: 1970-05-13, 43 y.o.   MRN: [id number]   Patient seen again.  Pain controlled.  States that she is doing well.  About to work with PT.  If she does well will plan on discharging home today.  Patient states that she wants to go home.    Exam:  Neurologically intact.  bilat ant tib, gastroc, EHL are strong.  Calves nontender.

## 2013-08-24 NOTE — Evaluation (Signed)
I have read and agree with this note.   Time in/out:13:16-13:44  Total time:28 minutes (EV and 2 [place])  [place], OTR/L [phone]

## 2013-08-24 NOTE — Op Note (Signed)
[name] [name], [name] [name]            ACCOUNT NO.:  [id number]  MEDICAL RECORD NO.:  [id number]  LOCATION:  [id number]                        FACILITY:  [facility]  PHYSICIAN:  Dahlia Bailiff, MD    DATE OF BIRTH:  [birth year]  DATE OF PROCEDURE:  08/23/2013 DATE OF DISCHARGE:                              OPERATIVE REPORT   PREOPERATIVE DIAGNOSIS:  Grade 2 spondylolisthesis with radicular right leg pain, L4-5.  POSTOPERATIVE DIAGNOSIS:  Grade 2 spondylolisthesis with radicular right leg pain, L4-5.  OPERATIVE PROCEDURES: 1. Gill decompression, right side, L4-5. 2. Complete diskectomy with insertion of intervertebral biomechanical     device, Titan Titanium extra-large size 11 cage, packed with local     bone plus DBX. 3. Posterior segmental instrumentation, L4-5 with NuVasive pedicle     screw fixation device and posterolateral arthrodesis with allograft     bone, L4-5.  FIRST ASSISTANT:  Alyson Locket. Velora Heckler.  HISTORY:  This is a very pleasant 43 year old woman who has been having severe debilitating back, buttock, and right leg pain.  Despite appropriate conservative management consisting of injection therapy, physical therapy, anti-inflammatory medications, and injections, she continued to deteriorate.  As a result, we elected to proceed with surgery.  All appropriate risks, benefits, and alternatives were discussed with the patient and consent was obtained.  OPERATIVE NOTE:  The patient was brought to the operating room and placed supine on the operating table.  After successful induction of general anesthesia and endotracheal intubation, TEDs, SCDs, and Foley were inserted.  Needles were then placed for intraoperative monitoring of free running EMGs and SSEPs.  The patient was then turned prone onto the Wilson frame and all bony prominences were well padded,and the back was prepped and draped in standard fashion.  Time-out was taken, confirming patient, procedure and all  pertinent important data.  A small longitudinal incision was made, centered over the lateral aspect of the L4 pedicle.  A trocar was advanced percutaneously down to the junction of the transverse process and facet in significant facet hypertrophy and so, I attempted to medialize this.  I then advanced the Jamshidi needle under fluoroscopic guidance into the pedicle.  Once it reached the medial border of the pedicle, I then switched to the lateral view and confirmed that I was just beyond the posterior margin of the vertebral body.  Satisfied with my position, I advanced into the vertebral body.  At no point where there any abnormal EMGs or indication electrodiagnostically of pedicle breach.  I repeated this procedure at L5 level.  Once both cannulas were in place, I then tapped over the cannulas and placed 45-mm length, 6.5 diameter screw.  Both screws were tested and again, there was no neurodiagnostic evidence of breach and radiographically, the screws were properly positioned and excellent purchase.  I then went to the contralateral side and instead of two small incisions, I made one longitudinal incision.  This was about 3 fingerbreadths off to the right thigh and this would be a standard [place]se approach.  Sharp dissection was carried out down to the deep fascia.  Deep fascia was sharply incised and I bluntly dissected through the paraspinal muscles and adipose tissue to the spine.  Once I palpated the facet capsule, I then placed a Jamshidi needle on the junction of transverse process and facet, and using the same technique, I used on the contralateral side, advanced the trocar into the pedicle and vertebral body.  This was repeated at L5.  I then cannulated and then placed the appropriate size screws.  These screws were attached to the retracting devices, which were then attached to the arm.  I can now see the right posterolateral corner of the spine.  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  I removed remained of the inferior L4 facet and then began using my Kerrison rongeur to resect the overhanging osteophyte from the superior L5 facet. I then released the ligamentum flavum and identified the lateral border of the thecal sac.  I then performed a slight laminotomy of L4 and then I dissected into the lateral gutter.  At this point, I could now visualize the L4 nerve root.  I could then palpate the L4 pedicle with the [facility] elevator and there was no evidence of medial or inferior breach.  Inferiorly, I could identify the L5 nerve root and again palpate the medial and superior end of the pedicles to ensure there was no breach.  At this point with the decompression done, I then swept the thecal sac medially and protected it with the nerve root and visualized the 4 and 5 nerve roots themselves and then placed neural patty over to protect it.  An annulotomy with 15-blade scalpel was performed, and then used a various combinations of pituitary rongeurs, angled curettes, and Kerrison rongeurs to remove all the disk material at the L4-5 level. Once I had bony endplates that were bleeding, I then trialed and elected to put the size 11 large trial implant in.  I did take some CONFORM allograft sheet, placed it along the anterior annulus and packed it into position, and then malleted the graft into position.  The graft itself was well seated and properly positioned.  Once this was positioned, I then took the kyphosis at the bed and then attached the heads to the pedicle screws on the right side.  I then measured and placed appropriate-sized rod and secured it down.  The locking nuts were torqued off according to manufacture's standards.  I could then directly visualize enough rods superiorly and inferiorly, noted that was locked in place.  I irrigated the wound copiously with normal saline.  Made sure I had hemostasis  using bipolar electrocautery.  I then placed thrombin-soaked Gelfoam patty over the exposed thecal sac.  I then closed the deep fascia with interrupted #1 Vicryl sutures, superficial with 2-0 Vicryl sutures, and 3-0 Monocryl for the skin.  With the TLIF cage and right-sided pedicle screw right complex complete, I then went back to the left-hand side.  I measured the distance and obtained the appropriate-sized rod.  I then used a curette to decorticate the facet complex at that level.  I then placed the remaining portion of CONFORM bone in the posterolateral gutter.  The rod was then advanced and locked into place.  The locking nut was torqued according to manufacturer's standards.  The wounds were irrigated and then closed in the same fashion as the contralateral side.  At the end of the case, all needle and sponge counts were correct.  Final x-rays were satisfactory.  Hardware and graft were in good position.  The patient was ultimately extubated, transferred to the  PACU without incident.  This was a difficult case mainly due to her obesity.  My first assistant was vital in assisting with retraction, visualization, wound closure.     Dahlia Bailiff, MD     DDB/MEDQ  D:  08/23/2013  T:  08/24/2013  Job:  [id number]

## 2013-08-24 NOTE — Progress Notes (Signed)
    Subjective: Procedure(s) (LRB): TRANSFORAMINAL LUMBAR INTERBODY FUSION (TLIF) WITH PEDICLE SCREW FIXATION L4 - L5 1 LEVEL (N/A) 1 Day Post-Op  Patient reports pain as 2 on 0-10 scale.  Reports decreased leg pain reports incisional back pain   Positive void Negative bowel movement Positive flatus Negative chest pain or shortness of breath  Objective: Vital signs in last 24 hours: Temp:  [97.5 F (36.4 C)-98.2 F (36.8 C)] 98 F (36.7 C) (05/29 0530) Pulse Rate:  [84-116] 99 (05/29 0530) Resp:  [10-20] 18 (05/29 0530) BP: (117-154)/(71-95) 117/71 mmHg (05/29 0530) SpO2:  [92 %-100 %] 92 % (05/29 0530)  Intake/Output from previous day: 05/28 0701 - 05/29 0700 In: 3333.3 [P.O.:320; I.V.:2613.3; IV Piggyback:400] Out: 1045 [Urine:745; Blood:300]  Labs: No results found for this basename: WBC, RBC, HCT, PLT,  in the last 72 hours No results found for this basename: NA, K, CL, CO2, BUN, CREATININE, GLUCOSE, CALCIUM,  in the last 72 hours No results found for this basename: LABPT, INR,  in the last 72 hours  Physical Exam: Neurologically intact ABD soft Neurovascular intact Incision: dressing C/D/I Compartment soft  Assessment/Plan: Patient stable  xrays pending Continue mobilization with physical therapy Continue care  Advance diet Up with therapy D/C IV fluids PT today - must work with stairs Possible d/c today or in AM   Melina Schools, [place] [phone]

## 2013-08-24 NOTE — Progress Notes (Signed)
CARE MANAGEMENT NOTE 08/24/2013  Patient:  [name] [name], [name] [name]   Account Number:  [id number]  Date Initiated:  08/24/2013  Documentation initiated by:  [facility]  Subjective/Objective Assessment:   admitted s/p TLIF L4-5     Action/Plan:   PT eval-no therapy or equipment recommended   Anticipated DC Date:  08/24/2013   Anticipated DC Plan:  HOME/SELF CARE

## 2013-08-24 NOTE — Progress Notes (Signed)
CSW (Clinical Education officer, museum) aware of consult. At this time, MD note indicates plan is for pt to return home. Pt has no social work needs. Please reconsult should needs arise.   [place], [place]

## 2013-08-24 NOTE — Progress Notes (Signed)
Discharge paperwork, prescriptions, reasons to return to ED, and follow up appts discussed with pt. Pt understood instructions and RN confirmed pt's learning with teachback method. PIV removed. Pt d/c to care of Aunt.

## 2013-08-24 NOTE — Plan of Care (Signed)
Problem: Consults Goal: Diagnosis - Spinal Surgery Outcome: Completed/Met Date Met:  08/24/13 Thoraco/Lumbar Spine Fusion

## 2013-08-24 NOTE — Evaluation (Signed)
Physical Therapy Evaluation Patient Details Name: Karlin Binion MRN: [id number] DOB: 09-15-1970 Today's Date: 08/24/2013   History of Present Illness  elective TLIF L4-5  Clinical Impression  Patient min-guard to modified independent for all mobility and will have appropriate assistance at d/c.  Feel patient safe for d/c from PT standpoint.  No further needs identified.  Educated patient on progression of ambulation and back safety.    Follow Up Recommendations No PT follow up    Equipment Recommendations  None recommended by PT    Recommendations for Other Services       Precautions / Restrictions Precautions Precautions: Back Required Braces or Orthoses: Spinal Brace Spinal Brace: Lumbar corset (already donned)      Mobility  Bed Mobility Overal bed mobility: Modified Independent             General bed mobility comments: cueing for proper sequence and to maintain body mechanics  Transfers Overall transfer level: Modified independent Equipment used: None                Ambulation/Gait Ambulation/Gait assistance: Min guard Ambulation Distance (Feet): 100 Feet Assistive device: None Gait Pattern/deviations: Step-through pattern;Wide base of support Gait velocity: decreased      Stairs Stairs: Yes Stairs assistance: Modified independent (Device/Increase time) Stair Management: One rail Right Number of Stairs: 3    Wheelchair Mobility    Modified Rankin (Stroke Patients Only)       Balance Overall balance assessment: No apparent balance deficits (not formally assessed)                                           Pertinent Vitals/Pain Denies pain    Home Living Family/patient expects to be discharged to:: Private residence Living Arrangements: Alone Available Help at Discharge: Friend(s) Type of Home: House (townhome) Home Access: Stairs to enter Entrance Stairs-Rails: None Technical brewer of Steps: 1 Home  Layout: Two level Home Equipment: None      Prior Function Level of Independence: Independent               Hand Dominance        Extremity/Trunk Assessment   Upper Extremity Assessment: Overall WFL for tasks assessed           Lower Extremity Assessment: Overall WFL for tasks assessed         Communication   Communication: No difficulties  Cognition Arousal/Alertness: Awake/alert Behavior During Therapy: WFL for tasks assessed/performed Overall Cognitive Status: Within Functional Limits for tasks assessed                      General Comments      Exercises        Assessment/Plan    PT Assessment Patent does not need any further PT services  PT Diagnosis     PT Problem List    PT Treatment Interventions     PT Goals (Current goals can be found in the Care Plan section) Acute Rehab PT Goals PT Goal Formulation: No goals set, d/c therapy    Frequency     Barriers to discharge        Co-evaluation               End of Session Equipment Utilized During Treatment: Back brace Activity Tolerance: Patient tolerated treatment well Patient left: in chair;with call bell/phone within reach  Nurse Communication: Mobility status         Time: 5852-7782 PT Time Calculation (min): 17 min   Charges:   PT Evaluation $Initial PT Evaluation Tier I: 1 Procedure PT Treatments $Gait Training: 8-22 mins   PT G CodesAdolm Joseph [place], [place] [phone] 08/24/2013, 12:01 PM

## 2013-08-24 NOTE — Evaluation (Signed)
Occupational Therapy Evaluation and Discharge Patient Details Name: [name] [name] MRN: [id number] DOB: [birth year] Today's Date: 08/24/2013    History of Present Illness elective TLIF L4-5   Clinical Impression   Pt is a 43 yo female presenting with limited ROM due to above interfering with her ability to perform her ADL's independently. Pt's current level is modified independent due to back precautions. Pt was educated on and demostrated ADL techniques, a tub and toilet transfer and was able to recall her back precautions. Pt was up and moving around good in her brace and therefore no longer needs OT services.    Follow Up Recommendations  No OT follow up    Equipment Recommendations  None recommended by OT       Precautions / Restrictions Precautions Precautions: Back Required Braces or Orthoses: Spinal Brace Spinal Brace: Lumbar corset      Mobility   Transfers Overall transfer level: Modified independent Equipment used: None                  Balance Overall balance assessment: No apparent balance deficits (not formally assessed)                                          ADL Overall ADL's : Needs assistance/impaired Eating/Feeding: Independent   Grooming: Standing;Modified independent   Upper Body Bathing: Sitting;Modified independent   Lower Body Bathing: Sitting/lateral leans;Modified independent Lower Body Bathing Details (indicate cue type and reason): Pt said she will be doing a sponge bath for LB bathing while sitting Upper Body Dressing : Sitting;Modified independent   Lower Body Dressing: Modified independent;Sit to/from stand Lower Body Dressing Details (indicate cue type and reason): crossing her legs over to adhere to back precautions Toilet Transfer: Modified Independent;Regular Toilet;Ambulation   Toileting- Clothing Manipulation and Hygiene: Modified independent;With adaptive equipment Toileting - Clothing Manipulation  Details (indicate cue type and reason): using toilet aide Tub/ Shower Transfer: Tub transfer;Modified independent;Adhering to back precautions Tub/Shower Transfer Details (indicate cue type and reason): using the wall for support and bending at the knee while keeping the hip straight Functional mobility during ADLs: Modified independent General ADL Comments: Pt has to modify her ADLs to follow her back precautions.               Pertinent Vitals/Pain No c/o pain.        Extremity/Trunk Assessment Upper Extremity Assessment Upper Extremity Assessment: Overall WFL for tasks assessed   Lower Extremity Assessment Lower Extremity Assessment: Defer to PT evaluation       Communication Communication Communication: No difficulties   Cognition Arousal/Alertness: Awake/alert Behavior During Therapy: WFL for tasks assessed/performed Overall Cognitive Status: Within Functional Limits for tasks assessed                                Home Living Family/patient expects to be discharged to:: Private residence Living Arrangements: Alone Available Help at Discharge: Friend(s) Type of Home: House Home Access: Stairs to enter Technical brewer of Steps: 1 Entrance Stairs-Rails: None Home Layout: Two level Alternate Level Stairs-Number of Steps: 12 Alternate Level Stairs-Rails: Right           Home Equipment: Adaptive equipment Adaptive Equipment: Other (Comment) Additional Comments: toilet aide      Prior Functioning/Environment Level of Independence: Independent  End of Session Equipment Utilized During Treatment: Back brace  Activity Tolerance: Patient tolerated treatment well Patient left:  (pt was being discharged)   Time: 4098-1191 OT Time Calculation (min): 28 min Charges:  OT General Charges $OT Visit: 1 Procedure OT Evaluation $Initial OT Evaluation Tier I: 1 Procedure OT Treatments $Self  Care/Home Management : 23-37 mins G-Codes:    Lyda Perone Sep 01, 2013, 3:08 PM

## 2013-09-05 NOTE — Discharge Summary (Signed)
Patient ID: [name] [name] MRN: [id number] DOB/AGE: 45-Oct-1972 45 y.o.  Admit date: 08/23/2013 Discharge date: 09/05/2013  Admission Diagnoses:  Active Problems:   Back pain   Discharge Diagnoses:  Active Problems:   Back pain  status post Procedure(s): TRANSFORAMINAL LUMBAR INTERBODY FUSION (TLIF) WITH PEDICLE SCREW FIXATION L4 - L5 1 LEVEL  Past Medical History  Diagnosis Date  . ANXIETY 08/22/2007  . NICOTINE ADDICTION 06/26/2007  . DEPRESSION 08/22/2007  . ALLERGIC RHINITIS 06/26/2007  . HYPERGLYCEMIA 08/22/2007  . NONSPECIFIC ABNORM RESULTS THYROID FUNCT STUDY 08/22/2007  . MYALGIA 04/15/2010  . Fibromyalgia   . DM (dermatomyositis)   . Spontaneous pneumothorax   . GERD (gastroesophageal reflux disease)   . Sinusitis   . ASTHMA 06/26/2007    last attack 1 mo ago  . ASTHMA, WITH ACUTE EXACERBATION 07/26/2007    Surgeries: Procedure(s): TRANSFORAMINAL LUMBAR INTERBODY FUSION (TLIF) WITH PEDICLE SCREW FIXATION L4 - L5 1 LEVEL on 08/23/2013   Consultants:    Discharged Condition: Improved  Hospital Course: [name] [name] is an 43 y.o. female who was admitted 08/23/2013 for operative treatment of lumbar stenosis.  Patient failed conservative treatments (please see the history and physical for the specifics) and had severe unremitting pain that affects sleep, daily activities and work/hobbies. After pre-op clearance, the patient was taken to the operating room on 08/23/2013 and underwent  Procedure(s): TRANSFORAMINAL LUMBAR INTERBODY FUSION (TLIF) WITH PEDICLE SCREW FIXATION L4 - L5 1 LEVEL.    Patient was given perioperative antibiotics:  Anti-infectives   Start     Dose/Rate Route Frequency Ordered Stop   08/23/13 2200  hydroxychloroquine (PLAQUENIL) tablet 200 mg  Status:  Discontinued     200 mg Oral 2 times daily 08/23/13 1539 08/24/13 1647   08/23/13 1600  ceFAZolin (ANCEF) IVPB 1 g/50 mL premix     1 g 100 mL/hr over 30 Minutes Intravenous Every 8 hours  08/23/13 1539 08/24/13 0113   08/23/13 0712  ceFAZolin (ANCEF) 2-3 GM-% IVPB SOLR    Comments:  Blair Heys   : cabinet override      08/23/13 0712 08/23/13 0752       Patient was given sequential compression devices and early ambulation to prevent DVT.   Patient benefited maximally from hospital stay and there were no complications. At the time of discharge, the patient was urinating/moving their bowels without difficulty, tolerating a regular diet, pain is controlled with oral pain medications and they have been cleared by PT/OT.   Recent vital signs: No data found.    Recent laboratory studies: No results found for this basename: WBC, HGB, HCT, PLT, NA, K, CL, CO2, BUN, CREATININE, GLUCOSE, PT, INR, CALCIUM, 2,  in the last 72 hours   Discharge Medications:     Medication List    STOP taking these medications       acetaminophen 500 MG tablet  Commonly known as:  TYLENOL     celecoxib 200 MG capsule  Commonly known as:  CELEBREX     cyclobenzaprine 5 MG tablet  Commonly known as:  FLEXERIL     HYDROcodone-acetaminophen 5-325 MG per tablet  Commonly known as:  NORCO/VICODIN  Replaced by:  HYDROcodone-acetaminophen 10-325 MG per tablet      TAKE these medications       albuterol 108 (90 BASE) MCG/ACT inhaler  Commonly known as:  PROVENTIL HFA;VENTOLIN HFA  Inhale 1-2 puffs into the lungs every 6 (six) hours as needed for wheezing or shortness of  breath.     cetirizine 10 MG tablet  Commonly known as:  ZYRTEC  Take 10 mg by mouth at bedtime.     citalopram 20 MG tablet  Commonly known as:  CELEXA  Take 20 mg by mouth daily.     docusate sodium 100 MG capsule  Commonly known as:  COLACE  Take 1 capsule (100 mg total) by mouth 2 (two) times daily.     fluticasone 50 MCG/ACT nasal spray  Commonly known as:  FLONASE  Place 1 spray into both nostrils every morning.     HYDROcodone-acetaminophen 10-325 MG per tablet  Commonly known as:  NORCO  Take 1 tablet by  mouth every 4 (four) hours as needed.     LORazepam 0.5 MG tablet  Commonly known as:  ATIVAN  Take 0.5 mg by mouth every 8 (eight) hours as needed for anxiety.     methocarbamol 500 MG tablet  Commonly known as:  ROBAXIN  Take 1 tablet (500 mg total) by mouth every 6 (six) hours as needed for muscle spasms.     omeprazole 40 MG capsule  Commonly known as:  PRILOSEC  Take 40 mg by mouth daily.     ondansetron 4 MG disintegrating tablet  Commonly known as:  ZOFRAN ODT  Take 1 tablet (4 mg total) by mouth every 8 (eight) hours as needed.     PLAQUENIL 200 MG tablet  Generic drug:  hydroxychloroquine  Take 200 mg by mouth 2 (two) times daily.     polyethylene glycol packet  Commonly known as:  MIRALAX / GLYCOLAX  Take 17 g by mouth daily.     varenicline 1 MG tablet  Commonly known as:  CHANTIX  Take 1 mg by mouth 2 (two) times daily.     Vitamin D (Ergocalciferol) 50000 UNITS Caps capsule  Commonly known as:  DRISDOL  Take 50,000 Units by mouth every Sunday.     Vitamin D (Ergocalciferol) 50000 UNITS Caps capsule  Commonly known as:  DRISDOL  Take 1 capsule (50,000 Units total) by mouth every 7 (seven) days.        Diagnostic Studies: Dg Lumbar Spine 2-3 Views  08/24/2013   CLINICAL DATA:  s/p spine fusion  EXAM: LUMBAR SPINE - 2-3 VIEW  COMPARISON:  Yesterday  FINDINGS: Bilateral pedicle screws and cross-table as in bars with a disc spacer are present at L4 and L5 for fusion. Stable mild anterolisthesis L4 upon L5. No compression deformities. Disc space narrowing at L3-4 unchanged.  IMPRESSION: L4-5 fusion without complication.   Electronically Signed   By: Maryclare Bean M.D.   On: 08/24/2013 11:32   Dg Lumbar Spine 2-3 Views  08/23/2013   FLUOROSCOPY TIME:  2 min 8 seconds  C-arm fluoroscopic images were obtained intraoperatively and submitted for post operative interpretation. Please see the performing provider's procedural report for the fluoroscopy time utilized.  EXAM: DG  C-ARM 61-120 MIN; LUMBAR SPINE - 2-3 VIEW  COMPARISON:  None  FINDINGS: AP and lateral images of the lower lumbar spine demonstrate the patient has undergone interbody and posterior fusion at L4-5. Hardware appears in good position.  IMPRESSION: Fusion performed at L4-5.   Electronically Signed   By: Rozetta Nunnery M.D.   On: 08/23/2013 13:14   Dg C-arm 61-120 Min  08/23/2013   FLUOROSCOPY TIME:  2 min 8 seconds  C-arm fluoroscopic images were obtained intraoperatively and submitted for post operative interpretation. Please see the performing provider's procedural report for  the fluoroscopy time utilized.  EXAM: DG C-ARM 61-120 MIN; LUMBAR SPINE - 2-3 VIEW  COMPARISON:  None  FINDINGS: AP and lateral images of the lower lumbar spine demonstrate the patient has undergone interbody and posterior fusion at L4-5. Hardware appears in good position.  IMPRESSION: Fusion performed at L4-5.   Electronically Signed   By: Rozetta Nunnery M.D.   On: 08/23/2013 13:14        Discharge Instructions   Call MD / Call 911    Complete by:  As directed   If you experience chest pain or shortness of breath, CALL 911 and be transported to the hospital emergency room.  If you develope a fever above 101 F, pus (white drainage) or increased drainage or redness at the wound, or calf pain, call your surgeon's office.     Constipation Prevention    Complete by:  As directed   Drink plenty of fluids.  Prune juice may be helpful.  You may use a stool softener, such as Colace (over the counter) 100 mg twice a day.  Use MiraLax (over the counter) for constipation as needed.     Diet - low sodium heart healthy    Complete by:  As directed      Discharge instructions    Complete by:  As directed   Ok to shower 5 days postop.  Do not apply any creams or ointments to incision.  Do not remove steri-strips.  Can use 4x4 gauze and tape for dressing changes.  No aggressive activity.  No bending, squatting or prolonged sitting.  Mostly be in  reclined position or lying down.  Must wear brace when up and ambulating.  NO SMOKING.     Driving restrictions    Complete by:  As directed   No driving until further notice.     Increase activity slowly as tolerated    Complete by:  As directed      Lifting restrictions    Complete by:  As directed   No lifting until further notice.           Follow-up Information   Schedule an appointment as soon as possible for a visit with Dahlia Bailiff, MD. (need return office visit 2 weeks postop)    Specialty:  Orthopedic Surgery   Contact information:   [address] [place] [address] [place] [address] [phone]       Discharge Plan:  discharge to home  Disposition:     Signed: Lanae Crumbly for Dr. Melina Schools  [facility] [phone] 09/05/2013, 9:41 AM

## 2013-09-10 NOTE — Discharge Summary (Signed)
Agree with above Plan as discussed

## 2013-09-21 NOTE — OR Nursing (Signed)
Addendum to scope page 

## 2013-09-26 ENCOUNTER — Other Ambulatory Visit: Payer: Self-pay | Admitting: Family Medicine

## 2013-10-01 ENCOUNTER — Telehealth: Payer: Self-pay | Admitting: Family Medicine

## 2013-10-01 NOTE — Telephone Encounter (Signed)
Pharm following up  On refill request LORazepam (ATIVAN) 0.5 MG tablet Sent in 7/1

## 2013-10-03 NOTE — Telephone Encounter (Signed)
This was done.

## 2013-10-03 NOTE — Telephone Encounter (Signed)
Call in #[phone] with 5 rf 

## 2013-12-09 DIAGNOSIS — Z0279 Encounter for issue of other medical certificate: Secondary | ICD-10-CM

## 2013-12-10 ENCOUNTER — Other Ambulatory Visit: Payer: Self-pay | Admitting: Family Medicine

## 2013-12-26 ENCOUNTER — Emergency Department (HOSPITAL_BASED_OUTPATIENT_CLINIC_OR_DEPARTMENT_OTHER)
Admission: EM | Admit: 2013-12-26 | Discharge: 2013-12-26 | Disposition: A | Discharge status: Home / Self Care | Payer: BC Managed Care – PPO | Attending: Emergency Medicine | Admitting: Emergency Medicine

## 2013-12-26 ENCOUNTER — Encounter (HOSPITAL_BASED_OUTPATIENT_CLINIC_OR_DEPARTMENT_OTHER): Payer: Self-pay | Admitting: Emergency Medicine

## 2013-12-26 ENCOUNTER — Emergency Department (HOSPITAL_BASED_OUTPATIENT_CLINIC_OR_DEPARTMENT_OTHER): Payer: BC Managed Care – PPO

## 2013-12-26 DIAGNOSIS — D259 Leiomyoma of uterus, unspecified: Secondary | ICD-10-CM | POA: Insufficient documentation

## 2013-12-26 DIAGNOSIS — Z79899 Other long term (current) drug therapy: Secondary | ICD-10-CM | POA: Insufficient documentation

## 2013-12-26 DIAGNOSIS — J45909 Unspecified asthma, uncomplicated: Secondary | ICD-10-CM | POA: Insufficient documentation

## 2013-12-26 DIAGNOSIS — K219 Gastro-esophageal reflux disease without esophagitis: Secondary | ICD-10-CM | POA: Insufficient documentation

## 2013-12-26 DIAGNOSIS — Z3202 Encounter for pregnancy test, result negative: Secondary | ICD-10-CM | POA: Insufficient documentation

## 2013-12-26 DIAGNOSIS — IMO0002 Reserved for concepts with insufficient information to code with codable children: Secondary | ICD-10-CM | POA: Insufficient documentation

## 2013-12-26 DIAGNOSIS — Z9079 Acquired absence of other genital organ(s): Secondary | ICD-10-CM | POA: Insufficient documentation

## 2013-12-26 DIAGNOSIS — N83201 Unspecified ovarian cyst, right side: Secondary | ICD-10-CM

## 2013-12-26 DIAGNOSIS — F411 Generalized anxiety disorder: Secondary | ICD-10-CM | POA: Insufficient documentation

## 2013-12-26 DIAGNOSIS — R1031 Right lower quadrant pain: Secondary | ICD-10-CM | POA: Insufficient documentation

## 2013-12-26 DIAGNOSIS — Z9889 Other specified postprocedural states: Secondary | ICD-10-CM | POA: Insufficient documentation

## 2013-12-26 DIAGNOSIS — N83209 Unspecified ovarian cyst, unspecified side: Secondary | ICD-10-CM | POA: Insufficient documentation

## 2013-12-26 DIAGNOSIS — F329 Major depressive disorder, single episode, unspecified: Secondary | ICD-10-CM | POA: Insufficient documentation

## 2013-12-26 DIAGNOSIS — Z8739 Personal history of other diseases of the musculoskeletal system and connective tissue: Secondary | ICD-10-CM | POA: Insufficient documentation

## 2013-12-26 DIAGNOSIS — F3289 Other specified depressive episodes: Secondary | ICD-10-CM | POA: Insufficient documentation

## 2013-12-26 LAB — URINALYSIS, ROUTINE W REFLEX MICROSCOPIC
Bilirubin Urine: NEGATIVE
GLUCOSE, UA: NEGATIVE mg/dL
KETONES UR: 15 mg/dL — AB
Nitrite: NEGATIVE
Protein, ur: NEGATIVE mg/dL
Specific Gravity, Urine: 1.021 (ref 1.005–1.030)
Urobilinogen, UA: 0.2 mg/dL (ref 0.0–1.0)
pH: 5 (ref 5.0–8.0)

## 2013-12-26 LAB — URINE MICROSCOPIC-ADD ON

## 2013-12-26 LAB — PREGNANCY, URINE: Preg Test, Ur: NEGATIVE

## 2013-12-26 MED ORDER — IBUPROFEN 800 MG PO TABS: 800.0000 mg | ORAL_TABLET | Freq: Three times a day (TID) | ORAL | Status: DC

## 2013-12-26 MED ORDER — HYDROCODONE-ACETAMINOPHEN 5-325 MG PO TABS: 2.0000 | ORAL_TABLET | ORAL | Status: DC | PRN

## 2013-12-26 NOTE — ED Provider Notes (Signed)
Medical screening examination/treatment/procedure(s) were performed by non-physician practitioner and as supervising physician I was immediately available for consultation/collaboration.   EKG Interpretation None        Sharyon Cable, MD 12/26/13 1404

## 2013-12-26 NOTE — ED Notes (Signed)
Patient transported to Ultrasound 

## 2013-12-26 NOTE — ED Notes (Signed)
C/o lower right abd pain. States hx of fibroid tumor. Pain is usually worse with monthly periods which she started on Monday. C/o slight nausea no vomiting.

## 2013-12-26 NOTE — Discharge Instructions (Signed)
Fibroids °Fibroids are lumps (tumors) that can occur any place in a woman's body. These lumps are not cancerous. Fibroids vary in size, weight, and where they grow. °HOME CARE °· Do not take aspirin. °· Write down the number of pads or tampons you use during your period. Tell your doctor. This can help determine the best treatment for you. °GET HELP RIGHT AWAY IF: °· You have pain in your lower belly (abdomen) that is not helped with medicine. °· You have cramps that are not helped with medicine. °· You have more bleeding between or during your period. °· You feel lightheaded or pass out (faint). °· Your lower belly pain gets worse. °MAKE SURE YOU: °· Understand these instructions. °· Will watch your condition. °· Will get help right away if you are not doing well or get worse. °Document Released: 04/17/2010 Document Revised: 06/07/2011 Document Reviewed: 04/17/2010 °[employer]® Patient Information ©2015 [employer]. This information is not intended to replace advice given to you by your health care provider. Make sure you discuss any questions you have with your health care provider. ° °Ovarian Cyst °An ovarian cyst is a fluid-filled sac that forms on an ovary. The ovaries are small organs that produce eggs in women. Various types of cysts can form on the ovaries. Most are not cancerous. Many do not cause problems, and they often go away on their own. Some may cause symptoms and require treatment. Common types of ovarian cysts include: °· Functional cysts--These cysts may occur every month during the menstrual cycle. This is normal. The cysts usually go away with the next menstrual cycle if the woman does not get pregnant. Usually, there are no symptoms with a functional cyst. °· Endometrioma cysts--These cysts form from the tissue that lines the uterus. They are also called "chocolate cysts" because they become filled with blood that turns brown. This type of cyst can cause pain in the lower abdomen during  intercourse and with your menstrual period. °· Cystadenoma cysts--This type develops from the cells on the outside of the ovary. These cysts can get very big and cause lower abdomen pain and pain with intercourse. This type of cyst can twist on itself, cut off its blood supply, and cause severe pain. It can also easily rupture and cause a lot of pain. °· Dermoid cysts--This type of cyst is sometimes found in both ovaries. These cysts may contain different kinds of body tissue, such as skin, teeth, hair, or cartilage. They usually do not cause symptoms unless they get very big. °· Theca lutein cysts--These cysts occur when too much of a certain hormone (human chorionic gonadotropin) is produced and overstimulates the ovaries to produce an egg. This is most common after procedures used to assist with the conception of a baby (in vitro fertilization). °CAUSES  °· Fertility drugs can cause a condition in which multiple large cysts are formed on the ovaries. This is called ovarian hyperstimulation syndrome. °· A condition called polycystic ovary syndrome can cause hormonal imbalances that can lead to nonfunctional ovarian cysts. °SIGNS AND SYMPTOMS  °Many ovarian cysts do not cause symptoms. If symptoms are present, they may include: °· Pelvic pain or pressure. °· Pain in the lower abdomen. °· Pain during sexual intercourse. °· Increasing girth (swelling) of the abdomen. °· Abnormal menstrual periods. °· Increasing pain with menstrual periods. °· Stopping having menstrual periods without being pregnant. °DIAGNOSIS  °These cysts are commonly found during a routine or annual pelvic exam. Tests may be ordered to find   out more about the cyst. These tests may include: °· Ultrasound. °· X-ray of the pelvis. °· CT scan. °· MRI. °· Blood tests. °TREATMENT  °Many ovarian cysts go away on their own without treatment. Your health care provider may want to check your cyst regularly for 2-3 months to see if it changes. For women in  menopause, it is particularly important to monitor a cyst closely because of the higher rate of ovarian cancer in menopausal women. When treatment is needed, it may include any of the following: °· A procedure to drain the cyst (aspiration). This may be done using a long needle and ultrasound. It can also be done through a laparoscopic procedure. This involves using a thin, lighted tube with a tiny camera on the end (laparoscope) inserted through a small incision. °· Surgery to remove the whole cyst. This may be done using laparoscopic surgery or an open surgery involving a larger incision in the lower abdomen. °· Hormone treatment or birth control pills. These methods are sometimes used to help dissolve a cyst. °HOME CARE INSTRUCTIONS  °· Only take over-the-counter or prescription medicines as directed by your health care provider. °· Follow up with your health care provider as directed. °· Get regular pelvic exams and Pap tests. °SEEK MEDICAL CARE IF:  °· Your periods are late, irregular, or painful, or they stop. °· Your pelvic pain or abdominal pain does not go away. °· Your abdomen becomes larger or swollen. °· You have pressure on your bladder or trouble emptying your bladder completely. °· You have pain during sexual intercourse. °· You have feelings of fullness, pressure, or discomfort in your stomach. °· You lose weight for no apparent reason. °· You feel generally ill. °· You become constipated. °· You lose your appetite. °· You develop acne. °· You have an increase in body and facial hair. °· You are gaining weight, without changing your exercise and eating habits. °· You think you are pregnant. °SEEK IMMEDIATE MEDICAL CARE IF:  °· You have increasing abdominal pain. °· You feel sick to your stomach (nauseous), and you throw up (vomit). °· You develop a fever that comes on suddenly. °· You have abdominal pain during a bowel movement. °· Your menstrual periods become heavier than usual. °MAKE SURE  YOU: °· Understand these instructions. °· Will watch your condition. °· Will get help right away if you are not doing well or get worse. °Document Released: 03/15/2005 Document Revised: 03/20/2013 Document Reviewed: 11/20/2012 °[employer]® Patient Information ©2015 [employer]. This information is not intended to replace advice given to you by your health care provider. Make sure you discuss any questions you have with your health care provider. ° °

## 2013-12-26 NOTE — ED Provider Notes (Signed)
CSN: [id number]     Arrival date & time 12/26/13  1122 History   First MD Initiated Contact with Patient 12/26/13 1155     Chief Complaint  Patient presents with  . Abdominal Pain     (Consider location/radiation/quality/duration/timing/severity/associated sxs/prior Treatment) Patient is a 43 y.o. female presenting with abdominal pain. The history is provided by the patient. No language interpreter was used.  Abdominal Pain Pain location:  RLQ Pain quality: aching   Pain radiates to:  Does not radiate Pain severity:  Moderate Timing:  Constant Progression:  Worsening Chronicity:  New Relieved by:  Nothing Worsened by:  Nothing tried Ineffective treatments:  None tried Associated symptoms: no dysuria   Risk factors: has not had multiple surgeries    Pt reports she was told by Orthopaedist that she has a fibroid. Fibroid was incidental on back imaging.  Pt reports she has been having pain in right lower abdomen groin area and is concerned it is due to fibroid.  Pt has had normal gyn evaluation 1 year ago.  Pt sees Dr. Harrington Challenger.   Past Medical History  Diagnosis Date  . ANXIETY 08/22/2007  . NICOTINE ADDICTION 06/26/2007  . DEPRESSION 08/22/2007  . ALLERGIC RHINITIS 06/26/2007  . HYPERGLYCEMIA 08/22/2007  . NONSPECIFIC ABNORM RESULTS THYROID FUNCT STUDY 08/22/2007  . MYALGIA 04/15/2010  . Fibromyalgia   . DM (dermatomyositis)   . Spontaneous pneumothorax   . GERD (gastroesophageal reflux disease)   . Sinusitis   . ASTHMA 06/26/2007    last attack 1 mo ago  . ASTHMA, WITH ACUTE EXACERBATION 07/26/2007   Past Surgical History  Procedure Laterality Date  . Fallopian tubectomy Right 2005  . Cervical conization w/bx    . Lung surgery    . Knee surgery    . Lumbar fusion  08/23/2013    L4 L5   DR BROOKS   No family history on file. History  Substance Use Topics  . Smoking status: Never Smoker   . Smokeless tobacco: Never Used     Comment: or less, trying to quit  . Alcohol Use:  1.0 oz/week    2 drink(s) per week   OB History   Grav Para Term Preterm Abortions TAB SAB Ect Mult Living                 Review of Systems  Gastrointestinal: Positive for abdominal pain.  Genitourinary: Negative for dysuria.  All other systems reviewed and are negative.     Allergies  Codeine  Home Medications   Prior to Admission medications   Medication Sig Start Date End Date Taking? Authorizing Provider  albuterol (PROVENTIL HFA;VENTOLIN HFA) 108 (90 BASE) MCG/ACT inhaler Inhale 1-2 puffs into the lungs every 6 (six) hours as needed for wheezing or shortness of breath.   Yes Historical Provider, MD  cetirizine (ZYRTEC) 10 MG tablet Take 10 mg by mouth at bedtime.   Yes Historical Provider, MD  citalopram (CELEXA) 20 MG tablet Take 20 mg by mouth daily.   Yes Historical Provider, MD  fluticasone (FLONASE) 50 MCG/ACT nasal spray Place 1 spray into both nostrils every morning.   Yes Historical Provider, MD  HYDROcodone-acetaminophen (NORCO) 10-325 MG per tablet Take 1 tablet by mouth every 4 (four) hours as needed. 08/24/13  Yes Benjiman Core, PA-C  hydroxychloroquine (PLAQUENIL) 200 MG tablet Take 200 mg by mouth 2 (two) times daily.    Yes Historical Provider, MD  LORazepam (ATIVAN) 0.5 MG tablet TAKE 1 TABLET BY MOUTH  EVERY 8 HOURS AS NEEDED   Yes Laurey Morale, MD  methocarbamol (ROBAXIN) 500 MG tablet Take 1 tablet (500 mg total) by mouth every 6 (six) hours as needed for muscle spasms. 08/24/13  Yes Benjiman Core, PA-C  omeprazole (PRILOSEC) 40 MG capsule Take 40 mg by mouth daily.   Yes Historical Provider, MD  VENTOLIN HFA 108 (90 BASE) MCG/ACT inhaler INHALE 2 PUFFS EVERY 4 HOURS AS NEEDED WHEEZING OR FOR SHORTNESS OF BREATH 12/11/13  Yes Laurey Morale, MD  Vitamin D, Ergocalciferol, (DRISDOL) 50000 UNITS CAPS capsule Take 1 capsule (50,000 Units total) by mouth every 7 (seven) days.   Yes Laurey Morale, MD  Vitamin D, Ergocalciferol, (DRISDOL) 50000 UNITS CAPS capsule Take  50,000 Units by mouth every Sunday.   Yes Historical Provider, MD  docusate sodium (COLACE) 100 MG capsule Take 1 capsule (100 mg total) by mouth 2 (two) times daily. 08/24/13   Benjiman Core, PA-C  ondansetron (ZOFRAN ODT) 4 MG disintegrating tablet Take 1 tablet (4 mg total) by mouth every 8 (eight) hours as needed. 08/24/13   Benjiman Core, PA-C  polyethylene glycol (MIRALAX / GLYCOLAX) packet Take 17 g by mouth daily. 08/24/13   Benjiman Core, PA-C  varenicline (CHANTIX) 1 MG tablet Take 1 mg by mouth 2 (two) times daily.    Historical Provider, MD   BP 159/102  Pulse 108  Temp(Src) 98.5 F (36.9 C) (Oral)  Resp 18  Ht 5\' 7"  (1.702 m)  Wt 180 lb (81.647 kg)  BMI 28.19 kg/m2  SpO2 97%  LMP 12/24/2013 Physical Exam  Nursing note and vitals reviewed. Constitutional: She is oriented to person, place, and time. She appears well-developed and well-nourished.  HENT:  Head: Normocephalic and atraumatic.  Eyes: Conjunctivae and EOM are normal. Pupils are equal, round, and reactive to light.  Neck: Normal range of motion.  Cardiovascular: Normal rate.   Pulmonary/Chest: Effort normal.  Abdominal: Soft. She exhibits no distension.  Musculoskeletal: Normal range of motion.  Neurological: She is alert and oriented to person, place, and time.  Skin: Skin is warm.  Psychiatric: She has a normal mood and affect.    ED Course  Procedures (including critical care time) Labs Review Labs Reviewed  PREGNANCY, URINE  URINALYSIS, ROUTINE W REFLEX MICROSCOPIC    Imaging Review US Transvaginal Non-ob  12/26/2013   CLINICAL DATA:  RIGHT lower quadrant pain for 2 days since menses began, abdominal pain, past history of ectopic pregnancy x 2, prior RIGHT salpingectomy  EXAM: TRANSABDOMINAL AND TRANSVAGINAL ULTRASOUND OF PELVIS  TECHNIQUE: Both transabdominal and transvaginal ultrasound examinations of the pelvis were performed. Transabdominal technique was performed for global imaging of the pelvis  including uterus, ovaries, adnexal regions, and pelvic cul-de-sac. It was necessary to proceed with endovaginal exam following the transabdominal exam to characterize a LEFT adnexal lesion.  COMPARISON:  None  FINDINGS: Uterus  Measurements: 7.8 x 3.2 x 3.8 cm. Normal myometrial echogenicity. Mass identified adjacent to the LEFT lateral aspect of the uterus, demonstrates contiguity with the LEFT lateral uterine wall likely an exophytic leiomyoma 2.8 x 2.6 x 2.7 cm. No additional uterine masses.  Endometrium  Thickness: 7 mm thick, normal. No endometrial fluid or focal abnormalities.  Right ovary  Measurements: 3.4 x 2.6 x 2.9 cm. Complex cystic lesion within RIGHT ovary 2.9 x 2.4 x 2.4 cm. Cystic mass contains thin septations without mural nodularity. Blood flow present within RIGHT ovary on color Doppler imaging.  Left ovary  Measurements: 1.9  x 1.5 x 1.9 cm. Normal morphology without mass. Blood flow appears to be present within LEFT ovary on color Doppler imaging, less well visualized than on RIGHT.  Other findings  No free pelvic fluid or additional adnexal masses.  IMPRESSION: Probable exophytic leiomyoma at LEFT lateral aspect of uterus 2.8 cm greatest size.  Complicated septated cystic lesion within RIGHT ovary 2.9 x 2.2 x 2.4 cm.  No other intrapelvic abnormalities.   Electronically Signed   By: Lavonia Dana M.D.   On: 12/26/2013 13:06   US Pelvis Complete  12/26/2013   CLINICAL DATA:  RIGHT lower quadrant pain for 2 days since menses began, abdominal pain, past history of ectopic pregnancy x 2, prior RIGHT salpingectomy  EXAM: TRANSABDOMINAL AND TRANSVAGINAL ULTRASOUND OF PELVIS  TECHNIQUE: Both transabdominal and transvaginal ultrasound examinations of the pelvis were performed. Transabdominal technique was performed for global imaging of the pelvis including uterus, ovaries, adnexal regions, and pelvic cul-de-sac. It was necessary to proceed with endovaginal exam following the transabdominal exam to  characterize a LEFT adnexal lesion.  COMPARISON:  None  FINDINGS: Uterus  Measurements: 7.8 x 3.2 x 3.8 cm. Normal myometrial echogenicity. Mass identified adjacent to the LEFT lateral aspect of the uterus, demonstrates contiguity with the LEFT lateral uterine wall likely an exophytic leiomyoma 2.8 x 2.6 x 2.7 cm. No additional uterine masses.  Endometrium  Thickness: 7 mm thick, normal. No endometrial fluid or focal abnormalities.  Right ovary  Measurements: 3.4 x 2.6 x 2.9 cm. Complex cystic lesion within RIGHT ovary 2.9 x 2.4 x 2.4 cm. Cystic mass contains thin septations without mural nodularity. Blood flow present within RIGHT ovary on color Doppler imaging.  Left ovary  Measurements: 1.9 x 1.5 x 1.9 cm. Normal morphology without mass. Blood flow appears to be present within LEFT ovary on color Doppler imaging, less well visualized than on RIGHT.  Other findings  No free pelvic fluid or additional adnexal masses.  IMPRESSION: Probable exophytic leiomyoma at LEFT lateral aspect of uterus 2.8 cm greatest size.  Complicated septated cystic lesion within RIGHT ovary 2.9 x 2.2 x 2.4 cm.  No other intrapelvic abnormalities.   Electronically Signed   By: Lavonia Dana M.D.   On: 12/26/2013 13:06     EKG Interpretation None      MDM   Final diagnoses:  Uterine leiomyoma, unspecified location  Ovarian cyst, right    [place]  septated cystic lesion right ovary  Leiomyoma left lateral uterus  Pt counseled on cyst and fibroid.   Pt advised she needs to see Dr. Harrington Challenger for evaluation.Pt has an appointment on Oct 21.  I suspect cyst as cause of pt's pain.   Pt given rx for hydrocodone and ibuprofen.   Fransico Meadow, PA-C 12/26/13 [employer], [place] 12/26/13 [phone]

## 2014-01-04 ENCOUNTER — Emergency Department (HOSPITAL_BASED_OUTPATIENT_CLINIC_OR_DEPARTMENT_OTHER): Payer: Self-pay

## 2014-01-04 ENCOUNTER — Encounter (HOSPITAL_BASED_OUTPATIENT_CLINIC_OR_DEPARTMENT_OTHER): Payer: Self-pay | Admitting: Emergency Medicine

## 2014-01-04 ENCOUNTER — Emergency Department (HOSPITAL_BASED_OUTPATIENT_CLINIC_OR_DEPARTMENT_OTHER): Payer: BC Managed Care – PPO

## 2014-01-04 ENCOUNTER — Emergency Department (HOSPITAL_BASED_OUTPATIENT_CLINIC_OR_DEPARTMENT_OTHER)
Admission: EM | Admit: 2014-01-04 | Discharge: 2014-01-04 | Disposition: A | Discharge status: Home / Self Care | Payer: Self-pay | Attending: Emergency Medicine | Admitting: Emergency Medicine

## 2014-01-04 DIAGNOSIS — Z3202 Encounter for pregnancy test, result negative: Secondary | ICD-10-CM | POA: Insufficient documentation

## 2014-01-04 DIAGNOSIS — K219 Gastro-esophageal reflux disease without esophagitis: Secondary | ICD-10-CM | POA: Insufficient documentation

## 2014-01-04 DIAGNOSIS — Z7951 Long term (current) use of inhaled steroids: Secondary | ICD-10-CM | POA: Insufficient documentation

## 2014-01-04 DIAGNOSIS — Z79899 Other long term (current) drug therapy: Secondary | ICD-10-CM | POA: Insufficient documentation

## 2014-01-04 DIAGNOSIS — Z791 Long term (current) use of non-steroidal anti-inflammatories (NSAID): Secondary | ICD-10-CM | POA: Insufficient documentation

## 2014-01-04 DIAGNOSIS — M797 Fibromyalgia: Secondary | ICD-10-CM | POA: Insufficient documentation

## 2014-01-04 DIAGNOSIS — J45901 Unspecified asthma with (acute) exacerbation: Secondary | ICD-10-CM | POA: Insufficient documentation

## 2014-01-04 DIAGNOSIS — F329 Major depressive disorder, single episode, unspecified: Secondary | ICD-10-CM | POA: Insufficient documentation

## 2014-01-04 DIAGNOSIS — F411 Generalized anxiety disorder: Secondary | ICD-10-CM | POA: Insufficient documentation

## 2014-01-04 DIAGNOSIS — R109 Unspecified abdominal pain: Secondary | ICD-10-CM | POA: Insufficient documentation

## 2014-01-04 LAB — URINALYSIS, ROUTINE W REFLEX MICROSCOPIC
GLUCOSE, UA: NEGATIVE mg/dL
HGB URINE DIPSTICK: NEGATIVE
Ketones, ur: 15 mg/dL — AB
Nitrite: NEGATIVE
PROTEIN: NEGATIVE mg/dL
Specific Gravity, Urine: 1.025 (ref 1.005–1.030)
Urobilinogen, UA: 1 mg/dL (ref 0.0–1.0)
pH: 5.5 (ref 5.0–8.0)

## 2014-01-04 LAB — CBC WITH DIFFERENTIAL/PLATELET
BASOS PCT: 0 % (ref 0–1)
Basophils Absolute: 0 10*3/uL (ref 0.0–0.1)
EOS ABS: 0.2 10*3/uL (ref 0.0–0.7)
EOS PCT: 2 % (ref 0–5)
HEMATOCRIT: 44 % (ref 36.0–46.0)
HEMOGLOBIN: 14.4 g/dL (ref 12.0–15.0)
Lymphocytes Relative: 29 % (ref 12–46)
Lymphs Abs: 2.6 10*3/uL (ref 0.7–4.0)
MCH: 30.7 pg (ref 26.0–34.0)
MCHC: 32.7 g/dL (ref 30.0–36.0)
MCV: 93.8 fL (ref 78.0–100.0)
MONO ABS: 0.6 10*3/uL (ref 0.1–1.0)
Monocytes Relative: 7 % (ref 3–12)
Neutro Abs: 5.6 10*3/uL (ref 1.7–7.7)
Neutrophils Relative %: 62 % (ref 43–77)
Platelets: 403 10*3/uL — ABNORMAL HIGH (ref 150–400)
RBC: 4.69 MIL/uL (ref 3.87–5.11)
RDW: 14.2 % (ref 11.5–15.5)
WBC: 9 10*3/uL (ref 4.0–10.5)

## 2014-01-04 LAB — URINE MICROSCOPIC-ADD ON

## 2014-01-04 LAB — COMPREHENSIVE METABOLIC PANEL
ALBUMIN: 4.3 g/dL (ref 3.5–5.2)
ALT: 32 U/L (ref 0–35)
ANION GAP: 16 — AB (ref 5–15)
AST: 33 U/L (ref 0–37)
Alkaline Phosphatase: 114 U/L (ref 39–117)
BILIRUBIN TOTAL: 0.5 mg/dL (ref 0.3–1.2)
BUN: 8 mg/dL (ref 6–23)
CO2: 25 mEq/L (ref 19–32)
CREATININE: 0.7 mg/dL (ref 0.50–1.10)
Calcium: 9.8 mg/dL (ref 8.4–10.5)
Chloride: 99 mEq/L (ref 96–112)
GFR calc Af Amer: >90 mL/min (ref >90)
GFR calc non Af Amer: >90 mL/min (ref >90)
Glucose, Bld: 118 mg/dL — ABNORMAL HIGH (ref 70–99)
Potassium: 4.2 mEq/L (ref 3.7–5.3)
Sodium: 140 mEq/L (ref 137–147)
Total Protein: 8.5 g/dL — ABNORMAL HIGH (ref 6.0–8.3)

## 2014-01-04 LAB — WET PREP, GENITAL
CLUE CELLS WET PREP: NONE SEEN
Trich, Wet Prep: NONE SEEN
Yeast Wet Prep HPF POC: NONE SEEN

## 2014-01-04 LAB — PREGNANCY, URINE: Preg Test, Ur: NEGATIVE

## 2014-01-04 MED ORDER — KETOROLAC TROMETHAMINE 30 MG/ML IJ SOLN
30.0000 mg | Freq: Once | INTRAMUSCULAR | Status: AC
  Administered 2014-01-04: 30 mg via INTRAVENOUS
  Filled 2014-01-04: qty 1

## 2014-01-04 MED ORDER — KETOROLAC TROMETHAMINE 10 MG PO TABS: 10.0000 mg | ORAL_TABLET | Freq: Four times a day (QID) | ORAL | Status: DC | PRN

## 2014-01-04 NOTE — ED Notes (Signed)
Patient transported to Ultrasound. Pt sts she will wait until she returns for her pain meds.

## 2014-01-04 NOTE — ED Notes (Signed)
Patient transported to Ultrasound 

## 2014-01-04 NOTE — Discharge Instructions (Signed)
Abdominal Pain, Women °Abdominal (stomach, pelvic, or belly) pain can be caused by many things. It is important to tell your doctor: °· The location of the pain. °· Does it come and go or is it present all the time? °· Are there things that start the pain (eating certain foods, exercise)? °· Are there other symptoms associated with the pain (fever, nausea, vomiting, diarrhea)? °All of this is helpful to know when trying to find the cause of the pain. °CAUSES  °· Stomach: virus or bacteria infection, or ulcer. °· Intestine: appendicitis (inflamed appendix), regional ileitis (Crohn's disease), ulcerative colitis (inflamed colon), irritable bowel syndrome, diverticulitis (inflamed diverticulum of the colon), or cancer of the stomach or intestine. °· Gallbladder disease or stones in the gallbladder. °· Kidney disease, kidney stones, or infection. °· Pancreas infection or cancer. °· Fibromyalgia (pain disorder). °· Diseases of the female organs: °¨ Uterus: fibroid (non-cancerous) tumors or infection. °¨ Fallopian tubes: infection or tubal pregnancy. °¨ Ovary: cysts or tumors. °¨ Pelvic adhesions (scar tissue). °¨ Endometriosis (uterus lining tissue growing in the pelvis and on the pelvic organs). °¨ Pelvic congestion syndrome (female organs filling up with blood just before the menstrual period). °¨ Pain with the menstrual period. °¨ Pain with ovulation (producing an egg). °¨ Pain with an IUD (intrauterine device, birth control) in the uterus. °¨ Cancer of the female organs. °· Functional pain (pain not caused by a disease, may improve without treatment). °· Psychological pain. °· Depression. °DIAGNOSIS  °Your doctor will decide the seriousness of your pain by doing an examination. °· Blood tests. °· X-rays. °· Ultrasound. °· CT scan (computed tomography, special type of X-ray). °· MRI (magnetic resonance imaging). °· Cultures, for infection. °· Barium enema (dye inserted in the large intestine, to better view it with  X-rays). °· Colonoscopy (looking in intestine with a lighted tube). °· Laparoscopy (minor surgery, looking in abdomen with a lighted tube). °· Major abdominal exploratory surgery (looking in abdomen with a large incision). °TREATMENT  °The treatment will depend on the cause of the pain.  °· Many cases can be observed and treated at home. °· Over-the-counter medicines recommended by your caregiver. °· Prescription medicine. °· Antibiotics, for infection. °· Birth control pills, for painful periods or for ovulation pain. °· Hormone treatment, for endometriosis. °· Nerve blocking injections. °· Physical therapy. °· Antidepressants. °· Counseling with a psychologist or psychiatrist. °· Minor or major surgery. °HOME CARE INSTRUCTIONS  °· Do not take laxatives, unless directed by your caregiver. °· Take over-the-counter pain medicine only if ordered by your caregiver. Do not take aspirin because it can cause an upset stomach or bleeding. °· Try a clear liquid diet (broth or water) as ordered by your caregiver. Slowly move to a bland diet, as tolerated, if the pain is related to the stomach or intestine. °· Have a thermometer and take your temperature several times a day, and record it. °· Bed rest and sleep, if it helps the pain. °· Avoid sexual intercourse, if it causes pain. °· Avoid stressful situations. °· Keep your follow-up appointments and tests, as your caregiver orders. °· If the pain does not go away with medicine or surgery, you may try: °¨ Acupuncture. °¨ Relaxation exercises (yoga, meditation). °¨ Group therapy. °¨ Counseling. °SEEK MEDICAL CARE IF:  °· You notice certain foods cause stomach pain. °· Your home care treatment is not helping your pain. °· You need stronger pain medicine. °· You want your IUD removed. °· You feel faint or   lightheaded. °· You develop nausea and vomiting. °· You develop a rash. °· You are having side effects or an allergy to your medicine. °SEEK IMMEDIATE MEDICAL CARE IF:  °· Your  pain does not go away or gets worse. °· You have a fever. °· Your pain is felt only in portions of the abdomen. The right side could possibly be appendicitis. The left lower portion of the abdomen could be colitis or diverticulitis. °· You are passing blood in your stools (bright red or black tarry stools, with or without vomiting). °· You have blood in your urine. °· You develop chills, with or without a fever. °· You pass out. °MAKE SURE YOU:  °· Understand these instructions. °· Will watch your condition. °· Will get help right away if you are not doing well or get worse. °Document Released: 01/10/2007 Document Revised: 07/30/2013 Document Reviewed: 01/30/2009 °[employer]® Patient Information ©2015 [employer]. This information is not intended to replace advice given to you by your health care provider. Make sure you discuss any questions you have with your health care provider. ° °

## 2014-01-04 NOTE — ED Provider Notes (Signed)
CSN: [id number]     Arrival date & time 01/04/14  1751 History   First MD Initiated Contact with Patient 01/04/14 1047     Chief Complaint  Patient presents with  . Flank Pain     (Consider location/radiation/quality/duration/timing/severity/associated sxs/prior Treatment) HPI Comments: Diagnosed with an ovarian cyst 2 weeks ago. Pain since then, but acutely worsened 8 hours ago.  Patient is a 43 y.o. female presenting with flank pain. The history is provided by the patient.  Flank Pain This is a new problem. The current episode started more than 1 week ago. The problem occurs constantly. The problem has not changed since onset.Pertinent negatives include no chest pain, no abdominal pain and no shortness of breath. Nothing aggravates the symptoms. Nothing relieves the symptoms.    Past Medical History  Diagnosis Date  . ANXIETY 08/22/2007  . NICOTINE ADDICTION 06/26/2007  . DEPRESSION 08/22/2007  . ALLERGIC RHINITIS 06/26/2007  . HYPERGLYCEMIA 08/22/2007  . NONSPECIFIC ABNORM RESULTS THYROID FUNCT STUDY 08/22/2007  . MYALGIA 04/15/2010  . Fibromyalgia   . DM (dermatomyositis)   . Spontaneous pneumothorax   . GERD (gastroesophageal reflux disease)   . Sinusitis   . ASTHMA 06/26/2007    last attack 1 mo ago  . ASTHMA, WITH ACUTE EXACERBATION 07/26/2007   Past Surgical History  Procedure Laterality Date  . Fallopian tubectomy Right 2005  . Cervical conization w/bx    . Lung surgery    . Knee surgery    . Lumbar fusion  08/23/2013    L4 L5   DR BROOKS   No family history on file. History  Substance Use Topics  . Smoking status: Never Smoker   . Smokeless tobacco: Never Used     Comment: or less, trying to quit  . Alcohol Use: 1.0 oz/week    2 drink(s) per week   OB History   Grav Para Term Preterm Abortions TAB SAB Ect Mult Living                 Review of Systems  Constitutional: Negative for fever.  Respiratory: Negative for cough and shortness of breath.    Cardiovascular: Negative for chest pain and leg swelling.  Gastrointestinal: Negative for vomiting and abdominal pain.  Genitourinary: Positive for flank pain.  All other systems reviewed and are negative.     Allergies  Codeine  Home Medications   Prior to Admission medications   Medication Sig Start Date End Date Taking? Authorizing Provider  albuterol (PROVENTIL HFA;VENTOLIN HFA) 108 (90 BASE) MCG/ACT inhaler Inhale 1-2 puffs into the lungs every 6 (six) hours as needed for wheezing or shortness of breath.    Historical Provider, MD  cetirizine (ZYRTEC) 10 MG tablet Take 10 mg by mouth at bedtime.    Historical Provider, MD  citalopram (CELEXA) 20 MG tablet Take 20 mg by mouth daily.    Historical Provider, MD  docusate sodium (COLACE) 100 MG capsule Take 1 capsule (100 mg total) by mouth 2 (two) times daily. 08/24/13   Benjiman Core, PA-C  fluticasone (FLONASE) 50 MCG/ACT nasal spray Place 1 spray into both nostrils every morning.    Historical Provider, MD  HYDROcodone-acetaminophen (NORCO) 10-325 MG per tablet Take 1 tablet by mouth every 4 (four) hours as needed. 08/24/13   Benjiman Core, PA-C  HYDROcodone-acetaminophen (NORCO/VICODIN) 5-325 MG per tablet Take 2 tablets by mouth every 4 (four) hours as needed. 12/26/13   Fransico Meadow, PA-C  hydroxychloroquine (PLAQUENIL) 200 MG tablet Take 200  mg by mouth 2 (two) times daily.     Historical Provider, MD  ibuprofen (ADVIL,MOTRIN) 800 MG tablet Take 1 tablet (800 mg total) by mouth 3 (three) times daily. 12/26/13   Fransico Meadow, PA-C  LORazepam (ATIVAN) 0.5 MG tablet TAKE 1 TABLET BY MOUTH EVERY 8 HOURS AS NEEDED    Laurey Morale, MD  methocarbamol (ROBAXIN) 500 MG tablet Take 1 tablet (500 mg total) by mouth every 6 (six) hours as needed for muscle spasms. 08/24/13   Benjiman Core, PA-C  omeprazole (PRILOSEC) 40 MG capsule Take 40 mg by mouth daily.    Historical Provider, MD  ondansetron (ZOFRAN ODT) 4 MG disintegrating tablet Take 1  tablet (4 mg total) by mouth every 8 (eight) hours as needed. 08/24/13   Benjiman Core, PA-C  polyethylene glycol (MIRALAX / GLYCOLAX) packet Take 17 g by mouth daily. 08/24/13   Benjiman Core, PA-C  varenicline (CHANTIX) 1 MG tablet Take 1 mg by mouth 2 (two) times daily.    Historical Provider, MD  VENTOLIN HFA 108 (90 BASE) MCG/ACT inhaler INHALE 2 PUFFS EVERY 4 HOURS AS NEEDED WHEEZING OR FOR SHORTNESS OF BREATH 12/11/13   Laurey Morale, MD  Vitamin D, Ergocalciferol, (DRISDOL) 50000 UNITS CAPS capsule Take 1 capsule (50,000 Units total) by mouth every 7 (seven) days.    Laurey Morale, MD  Vitamin D, Ergocalciferol, (DRISDOL) 50000 UNITS CAPS capsule Take 50,000 Units by mouth every Sunday.    Historical Provider, MD   BP 161/94  Pulse 120  Temp(Src) 98.1 F (36.7 C) (Oral)  Resp 18  Ht 5\' 7"  (1.702 m)  Wt 177 lb (80.287 kg)  BMI 27.72 kg/m2  SpO2 100%  LMP 12/24/2013 Physical Exam  Nursing note and vitals reviewed. Constitutional: She is oriented to person, place, and time. She appears well-developed and well-nourished. No distress.  HENT:  Head: Normocephalic and atraumatic.  Mouth/Throat: Oropharynx is clear and moist.  Eyes: EOM are normal. Pupils are equal, round, and reactive to light.  Neck: Normal range of motion. Neck supple.  Cardiovascular: Normal rate and regular rhythm.  Exam reveals no friction rub.   No murmur heard. Pulmonary/Chest: Effort normal and breath sounds normal. No respiratory distress. She has no wheezes. She has no rales.  Abdominal: Soft. She exhibits no distension. There is no tenderness. There is no rebound.  Musculoskeletal: Normal range of motion. She exhibits no edema.  Neurological: She is alert and oriented to person, place, and time.  Skin: No rash noted. She is not diaphoretic.    ED Course  Procedures (including critical care time) Labs Review Labs Reviewed  GC/CHLAMYDIA PROBE AMP  WET PREP, GENITAL  PREGNANCY, URINE  URINALYSIS, ROUTINE W  REFLEX MICROSCOPIC  CBC WITH DIFFERENTIAL  COMPREHENSIVE METABOLIC PANEL    Imaging Review US Transvaginal Non-ob  01/04/2014   CLINICAL DATA:  Right lower quadrant and pelvic pain  EXAM: TRANSABDOMINAL AND TRANSVAGINAL ULTRASOUND OF PELVIS  DOPPLER ULTRASOUND OF OVARIES  TECHNIQUE: Study was performed transabdominally to optimize pelvic field of view evaluation and transvaginally to optimize internal visceral architecture evaluation.  Color and duplex Doppler ultrasound was utilized to evaluate blood flow to the ovaries.  COMPARISON:  December 26, 2013  FINDINGS: Uterus  Measurements: 7.3 x 3.1 x 3.8 cm. There is again noted a solid mass arising from the lateral aspect of the uterus toward the left measuring 2.7 x 2.7 x 2.4 cm felt to represent a leiomyoma. No other intrauterine lesion identified.  Endometrium  Thickness: 7 mm. No focal abnormality visualized. Endometrial contour is smooth.  Right ovary  Measurements: 2.6 x 1.7 x 1.6 cm. Normal appearance/no adnexal mass. Note that there are small follicles within the right ovary.  Left ovary  Left ovary could not be visualized due to overlying gas by either transabdominal or transvaginal technique. No left-sided mass appreciable.  Pulsed Doppler evaluation of the right ovary demonstrates normal low-resistance arterial and venous waveforms. The peak systolic velocity in the right ovary is 8 cm/sec.  Other findings  No free fluid.  IMPRESSION: Stable leiomyoma arising from the leftward aspect of the uterus. No new uterine lesion compared to recent prior study.  Left ovary could not be seen by either transabdominal or transvaginal technique. No left-sided pelvic masses seen.  Right ovary is appears within normal limits. Small follicles are noted in the right ovary. Beyond follicles, there is no right-sided pelvic mass. No evidence of ovarian torsion on the right.   Electronically Signed   By: Lowella Grip M.D.   On: 01/04/2014 11:50   US Pelvis  Complete  01/04/2014   CLINICAL DATA:  Right lower quadrant and pelvic pain  EXAM: TRANSABDOMINAL AND TRANSVAGINAL ULTRASOUND OF PELVIS  DOPPLER ULTRASOUND OF OVARIES  TECHNIQUE: Study was performed transabdominally to optimize pelvic field of view evaluation and transvaginally to optimize internal visceral architecture evaluation.  Color and duplex Doppler ultrasound was utilized to evaluate blood flow to the ovaries.  COMPARISON:  December 26, 2013  FINDINGS: Uterus  Measurements: 7.3 x 3.1 x 3.8 cm. There is again noted a solid mass arising from the lateral aspect of the uterus toward the left measuring 2.7 x 2.7 x 2.4 cm felt to represent a leiomyoma. No other intrauterine lesion identified.  Endometrium  Thickness: 7 mm. No focal abnormality visualized. Endometrial contour is smooth.  Right ovary  Measurements: 2.6 x 1.7 x 1.6 cm. Normal appearance/no adnexal mass. Note that there are small follicles within the right ovary.  Left ovary  Left ovary could not be visualized due to overlying gas by either transabdominal or transvaginal technique. No left-sided mass appreciable.  Pulsed Doppler evaluation of the right ovary demonstrates normal low-resistance arterial and venous waveforms. The peak systolic velocity in the right ovary is 8 cm/sec.  Other findings  No free fluid.  IMPRESSION: Stable leiomyoma arising from the leftward aspect of the uterus. No new uterine lesion compared to recent prior study.  Left ovary could not be seen by either transabdominal or transvaginal technique. No left-sided pelvic masses seen.  Right ovary is appears within normal limits. Small follicles are noted in the right ovary. Beyond follicles, there is no right-sided pelvic mass. No evidence of ovarian torsion on the right.   Electronically Signed   By: Lowella Grip M.D.   On: 01/04/2014 11:50   [place] Art/ven Flow Abd Pelv Doppler  01/04/2014   CLINICAL DATA:  Right lower quadrant and pelvic pain  EXAM: TRANSABDOMINAL AND  TRANSVAGINAL ULTRASOUND OF PELVIS  DOPPLER ULTRASOUND OF OVARIES  TECHNIQUE: Study was performed transabdominally to optimize pelvic field of view evaluation and transvaginally to optimize internal visceral architecture evaluation.  Color and duplex Doppler ultrasound was utilized to evaluate blood flow to the ovaries.  COMPARISON:  December 26, 2013  FINDINGS: Uterus  Measurements: 7.3 x 3.1 x 3.8 cm. There is again noted a solid mass arising from the lateral aspect of the uterus toward the left measuring 2.7 x 2.7 x 2.4 cm felt to  represent a leiomyoma. No other intrauterine lesion identified.  Endometrium  Thickness: 7 mm. No focal abnormality visualized. Endometrial contour is smooth.  Right ovary  Measurements: 2.6 x 1.7 x 1.6 cm. Normal appearance/no adnexal mass. Note that there are small follicles within the right ovary.  Left ovary  Left ovary could not be visualized due to overlying gas by either transabdominal or transvaginal technique. No left-sided mass appreciable.  Pulsed Doppler evaluation of the right ovary demonstrates normal low-resistance arterial and venous waveforms. The peak systolic velocity in the right ovary is 8 cm/sec.  Other findings  No free fluid.  IMPRESSION: Stable leiomyoma arising from the leftward aspect of the uterus. No new uterine lesion compared to recent prior study.  Left ovary could not be seen by either transabdominal or transvaginal technique. No left-sided pelvic masses seen.  Right ovary is appears within normal limits. Small follicles are noted in the right ovary. Beyond follicles, there is no right-sided pelvic mass. No evidence of ovarian torsion on the right.   Electronically Signed   By: Lowella Grip M.D.   On: 01/04/2014 11:50   Ct Renal Stone Study  01/04/2014   CLINICAL DATA:  Initial encounter for 1 month history of right flank pain.  EXAM: CT ABDOMEN AND PELVIS WITHOUT CONTRAST  TECHNIQUE: Multidetector CT imaging of the abdomen and pelvis was  performed following the standard protocol without IV contrast.  COMPARISON:  CT scan from 09/01/2012.  FINDINGS: Lower chest: 2 mm left lower lobe pulmonary nodule evident on image 3 series 4.  Hepatobiliary: No focal abnormality in the liver on this study without intravenous contrast. No evidence for hepatomegaly. There is no evidence for gallstones, gallbladder wall thickening, or pericholecystic fluid. Insert biliary  Pancreas: No focal mass lesion. No dilatation of the main duct. No intraparenchymal cyst. No peripancreatic edema.  Spleen: No splenomegaly. No focal mass lesion.  Adrenals/Urinary Tract: Stomach is nondistended. No gastric wall thickening. No adrenal nodule or mass. No stone in either kidney. No ureteral or bladder stones.  Stomach/Bowel: Stomach is nondistended. No gastric wall thickening. No evidence of outlet obstruction. Duodenum is normally positioned as is the ligament of Treitz. No small bowel wall thickening. No small bowel dilatation. Terminal ileum is normal. Appendix is normal. No gross colonic mass. No colonic wall thickening. No substantial diverticular change.  Vascular/Lymphatic: No abdominal aortic aneurysm. No gastrohepatic or hepatoduodenal ligament lymphadenopathy. No retroperitoneal lymphadenopathy. No pelvic sidewall lymphadenopathy.  Reproductive: 3.1 cm exophytic fibroid noted along the left posterior uterine fundus. No adnexal mass.  Other: No intraperitoneal free fluid.  Musculoskeletal: Patient is status post lower lumbar fusion. Bone windows reveal no worrisome lytic or sclerotic osseous lesions.  IMPRESSION: Stable exam. No findings to explain the patient's history of right flank pain.   Electronically Signed   By: Misty Stanley M.D.   On: 01/04/2014 13:06     EKG Interpretation None      MDM   Final diagnoses:  Flank pain    68F here with flank pain. Began 2 weeks ago after dx of ovarian cyst. Acutely worsened this morning. No fevers, vomiting, nausea,  diarrhea.  Patient without pelvic tenderness. Will scan for torsion first, plan for CT if [place] negative. [place] and CT without any abnormalities. Patient feeling better with pain meds, stable for discharge.    Evelina Bucy, MD 01/04/14 [phone]

## 2014-01-04 NOTE — ED Notes (Signed)
Pt has a complicated right ovarian cyst that has been hurting for a couple of weeks. She sts the pain became extremely bad at 3:00 am this morning.

## 2014-01-04 NOTE — ED Notes (Addendum)
Patient transported to ultrasound.

## 2014-01-04 NOTE — ED Notes (Signed)
Returned from U/S

## 2014-01-05 LAB — GC/CHLAMYDIA PROBE AMP
CT PROBE, AMP APTIMA: NEGATIVE
GC Probe RNA: NEGATIVE

## 2014-01-20 ENCOUNTER — Other Ambulatory Visit: Payer: Self-pay | Admitting: Family Medicine

## 2014-02-13 ENCOUNTER — Other Ambulatory Visit: Payer: Self-pay | Admitting: Family Medicine

## 2014-02-19 ENCOUNTER — Emergency Department (HOSPITAL_BASED_OUTPATIENT_CLINIC_OR_DEPARTMENT_OTHER)
Admission: EM | Admit: 2014-02-19 | Discharge: 2014-02-19 | Disposition: A | Discharge status: Home / Self Care | Payer: BC Managed Care – PPO | Attending: Emergency Medicine | Admitting: Emergency Medicine

## 2014-02-19 ENCOUNTER — Encounter (HOSPITAL_BASED_OUTPATIENT_CLINIC_OR_DEPARTMENT_OTHER): Payer: Self-pay | Admitting: *Deleted

## 2014-02-19 DIAGNOSIS — E860 Dehydration: Secondary | ICD-10-CM | POA: Insufficient documentation

## 2014-02-19 DIAGNOSIS — Z79899 Other long term (current) drug therapy: Secondary | ICD-10-CM | POA: Insufficient documentation

## 2014-02-19 DIAGNOSIS — R197 Diarrhea, unspecified: Secondary | ICD-10-CM | POA: Insufficient documentation

## 2014-02-19 DIAGNOSIS — F329 Major depressive disorder, single episode, unspecified: Secondary | ICD-10-CM | POA: Insufficient documentation

## 2014-02-19 DIAGNOSIS — Z872 Personal history of diseases of the skin and subcutaneous tissue: Secondary | ICD-10-CM | POA: Insufficient documentation

## 2014-02-19 DIAGNOSIS — R1110 Vomiting, unspecified: Secondary | ICD-10-CM | POA: Insufficient documentation

## 2014-02-19 DIAGNOSIS — Z791 Long term (current) use of non-steroidal anti-inflammatories (NSAID): Secondary | ICD-10-CM | POA: Insufficient documentation

## 2014-02-19 DIAGNOSIS — F419 Anxiety disorder, unspecified: Secondary | ICD-10-CM | POA: Insufficient documentation

## 2014-02-19 DIAGNOSIS — Z7951 Long term (current) use of inhaled steroids: Secondary | ICD-10-CM | POA: Insufficient documentation

## 2014-02-19 DIAGNOSIS — M797 Fibromyalgia: Secondary | ICD-10-CM | POA: Insufficient documentation

## 2014-02-19 DIAGNOSIS — J45909 Unspecified asthma, uncomplicated: Secondary | ICD-10-CM | POA: Insufficient documentation

## 2014-02-19 DIAGNOSIS — K219 Gastro-esophageal reflux disease without esophagitis: Secondary | ICD-10-CM | POA: Insufficient documentation

## 2014-02-19 LAB — URINALYSIS, ROUTINE W REFLEX MICROSCOPIC
Bilirubin Urine: NEGATIVE
Glucose, UA: NEGATIVE mg/dL
HGB URINE DIPSTICK: NEGATIVE
Ketones, ur: 15 mg/dL — AB
Leukocytes, UA: NEGATIVE
Nitrite: NEGATIVE
PROTEIN: NEGATIVE mg/dL
SPECIFIC GRAVITY, URINE: 1.029 (ref 1.005–1.030)
UROBILINOGEN UA: 0.2 mg/dL (ref 0.0–1.0)
pH: 5.5 (ref 5.0–8.0)

## 2014-02-19 MED ORDER — ONDANSETRON HCL 4 MG/2ML IJ SOLN
4.0000 mg | Freq: Once | INTRAMUSCULAR | Status: AC
  Administered 2014-02-19: 4 mg via INTRAVENOUS
  Filled 2014-02-19: qty 2

## 2014-02-19 MED ORDER — SODIUM CHLORIDE 0.9 % IV BOLUS (SEPSIS)
1000.0000 mL | Freq: Once | INTRAVENOUS | Status: AC
  Administered 2014-02-19: 1000 mL via INTRAVENOUS

## 2014-02-19 NOTE — ED Notes (Signed)
Weakness. States she feels dehydrated. Muscle cramps and near syncope. Diarrhea.

## 2014-02-19 NOTE — ED Provider Notes (Signed)
CSN: [id number]     Arrival date & time 02/19/14  1106 History   First MD Initiated Contact with Patient 02/19/14 1155     Chief Complaint  Patient presents with  . Weakness     (Consider location/radiation/quality/duration/timing/severity/associated sxs/prior Treatment) Patient is a 43 y.o. female presenting with weakness. The history is provided by the patient. No language interpreter was used.  Weakness This is a new problem. The current episode started in the past 7 days. The problem occurs constantly. The problem has been gradually worsening. Associated symptoms include weakness. Nothing aggravates the symptoms. She has tried nothing for the symptoms. The treatment provided moderate relief.   Pt complains of feeling weak.  Pt reports she had diarrhea and vomitting for 3 days.   Pt reports now only has small amount of diarrhea but she feels like she is dehydrated.   Past Medical History  Diagnosis Date  . ANXIETY 08/22/2007  . NICOTINE ADDICTION 06/26/2007  . DEPRESSION 08/22/2007  . ALLERGIC RHINITIS 06/26/2007  . HYPERGLYCEMIA 08/22/2007  . NONSPECIFIC ABNORM RESULTS THYROID FUNCT STUDY 08/22/2007  . MYALGIA 04/15/2010  . Fibromyalgia   . DM (dermatomyositis)   . Spontaneous pneumothorax   . GERD (gastroesophageal reflux disease)   . Sinusitis   . ASTHMA 06/26/2007    last attack 1 mo ago  . ASTHMA, WITH ACUTE EXACERBATION 07/26/2007   Past Surgical History  Procedure Laterality Date  . Fallopian tubectomy Right 2005  . Cervical conization w/bx    . Lung surgery    . Knee surgery    . Lumbar fusion  08/23/2013    L4 L5   DR BROOKS   No family history on file. History  Substance Use Topics  . Smoking status: Never Smoker   . Smokeless tobacco: Never Used     Comment: or less, trying to quit  . Alcohol Use: 1.0 oz/week    2 drink(s) per week   OB History    No data available     Review of Systems  Neurological: Positive for weakness.  All other systems reviewed and  are negative.     Allergies  Codeine  Home Medications   Prior to Admission medications   Medication Sig Start Date End Date Taking? Authorizing Provider  albuterol (PROVENTIL HFA;VENTOLIN HFA) 108 (90 BASE) MCG/ACT inhaler Inhale 1-2 puffs into the lungs every 6 (six) hours as needed for wheezing or shortness of breath.    Historical Provider, MD  cetirizine (ZYRTEC) 10 MG tablet Take 10 mg by mouth at bedtime.    Historical Provider, MD  citalopram (CELEXA) 20 MG tablet Take 20 mg by mouth daily.    Historical Provider, MD  citalopram (CELEXA) 20 MG tablet TAKE 1 TABLET BY MOUTH DAILY 02/13/14   Laurey Morale, MD  docusate sodium (COLACE) 100 MG capsule Take 1 capsule (100 mg total) by mouth 2 (two) times daily. 08/24/13   Benjiman Core, PA-C  fluticasone (FLONASE) 50 MCG/ACT nasal spray Place 1 spray into both nostrils every morning.    Historical Provider, MD  HYDROcodone-acetaminophen (NORCO) 10-325 MG per tablet Take 1 tablet by mouth every 4 (four) hours as needed. 08/24/13   Benjiman Core, PA-C  HYDROcodone-acetaminophen (NORCO/VICODIN) 5-325 MG per tablet Take 2 tablets by mouth every 4 (four) hours as needed. 12/26/13   Fransico Meadow, PA-C  hydroxychloroquine (PLAQUENIL) 200 MG tablet Take 200 mg by mouth 2 (two) times daily.     Historical Provider, MD  ibuprofen (ADVIL,MOTRIN)  800 MG tablet Take 1 tablet (800 mg total) by mouth 3 (three) times daily. 12/26/13   Fransico Meadow, PA-C  ketorolac (TORADOL) 10 MG tablet Take 1 tablet (10 mg total) by mouth every 6 (six) hours as needed. 01/04/14   Evelina Bucy, MD  LORazepam (ATIVAN) 0.5 MG tablet TAKE 1 TABLET BY MOUTH EVERY 8 HOURS AS NEEDED    Laurey Morale, MD  methocarbamol (ROBAXIN) 500 MG tablet Take 1 tablet (500 mg total) by mouth every 6 (six) hours as needed for muscle spasms. 08/24/13   Benjiman Core, PA-C  omeprazole (PRILOSEC) 40 MG capsule Take 40 mg by mouth daily.    Historical Provider, MD  omeprazole (PRILOSEC) 40 MG capsule  TAKE 1 CAPSULE (40 MG TOTAL) BY MOUTH DAILY. 01/21/14   Laurey Morale, MD  ondansetron (ZOFRAN ODT) 4 MG disintegrating tablet Take 1 tablet (4 mg total) by mouth every 8 (eight) hours as needed. 08/24/13   Benjiman Core, PA-C  polyethylene glycol (MIRALAX / GLYCOLAX) packet Take 17 g by mouth daily. 08/24/13   Benjiman Core, PA-C  varenicline (CHANTIX) 1 MG tablet Take 1 mg by mouth 2 (two) times daily.    Historical Provider, MD  VENTOLIN HFA 108 (90 BASE) MCG/ACT inhaler INHALE 2 PUFFS EVERY 4 HOURS AS NEEDED WHEEZING OR FOR SHORTNESS OF BREATH 12/11/13   Laurey Morale, MD  Vitamin D, Ergocalciferol, (DRISDOL) 50000 UNITS CAPS capsule Take 1 capsule (50,000 Units total) by mouth every 7 (seven) days.    Laurey Morale, MD  Vitamin D, Ergocalciferol, (DRISDOL) 50000 UNITS CAPS capsule Take 50,000 Units by mouth every Sunday.    Historical Provider, MD   BP 160/102 mmHg  Pulse 90  Temp(Src) 98.2 F (36.8 C) (Oral)  Resp 20  Wt 177 lb (80.287 kg)  SpO2 99%  LMP 01/21/2014 Physical Exam  Constitutional: She is oriented to person, place, and time. She appears well-developed and well-nourished.  HENT:  Head: Normocephalic.  Eyes: EOM are normal. Pupils are equal, round, and reactive to light.  Neck: Normal range of motion.  Cardiovascular: Normal rate and normal heart sounds.   Pulmonary/Chest: Effort normal.  Abdominal: She exhibits no distension.  Musculoskeletal: Normal range of motion.  Neurological: She is alert and oriented to person, place, and time.  Skin: Skin is warm.  Psychiatric: She has a normal mood and affect.  Nursing note and vitals reviewed.   ED Course  Procedures (including critical care time) Labs Review Labs Reviewed  URINALYSIS, ROUTINE W REFLEX MICROSCOPIC - Abnormal; Notable for the following:    Ketones, ur 15 (*)    All other components within normal limits    Imaging Review No results found.   EKG Interpretation None      MDM  Urine shows 15 ketones   Otherwise normal.   Pt given iv zofran and 1 liter Iv fluids.  Pt feels better on reexam.  Pt encourage to drik plenty of fluids.   Final diagnoses:  Dehydration, mild    AVS    Fransico Meadow, PA-C 02/19/14 [place], MD 02/19/14 [phone]

## 2014-02-19 NOTE — Discharge Instructions (Signed)
Dehydration, Adult Dehydration is when you lose more fluids from the body than you take in. Vital organs like the kidneys, brain, and heart cannot function without a proper amount of fluids and salt. Any loss of fluids from the body can cause dehydration.  CAUSES   Vomiting.  Diarrhea.  Excessive sweating.  Excessive urine output.  Fever. SYMPTOMS  Mild dehydration  Thirst.  Dry lips.  Slightly dry mouth. Moderate dehydration  Very dry mouth.  Sunken eyes.  Skin does not bounce back quickly when lightly pinched and released.  Dark urine and decreased urine production.  Decreased tear production.  Headache. Severe dehydration  Very dry mouth.  Extreme thirst.  Rapid, weak pulse (more than 100 beats per minute at rest).  Cold hands and feet.  Not able to sweat in spite of heat and temperature.  Rapid breathing.  Blue lips.  Confusion and lethargy.  Difficulty being awakened.  Minimal urine production.  No tears. DIAGNOSIS  Your caregiver will diagnose dehydration based on your symptoms and your exam. Blood and urine tests will help confirm the diagnosis. The diagnostic evaluation should also identify the cause of dehydration. TREATMENT  Treatment of mild or moderate dehydration can often be done at home by increasing the amount of fluids that you drink. It is best to drink small amounts of fluid more often. Drinking too much at one time can make vomiting worse. Refer to the home care instructions below. Severe dehydration needs to be treated at the hospital where you will probably be given intravenous (IV) fluids that contain water and electrolytes. HOME CARE INSTRUCTIONS   Ask your caregiver about specific rehydration instructions.  Drink enough fluids to keep your urine clear or pale yellow.  Drink small amounts frequently if you have nausea and vomiting.  Eat as you normally do.  Avoid:  Foods or drinks high in sugar.  Carbonated  drinks.  Juice.  Extremely hot or cold fluids.  Drinks with caffeine.  Fatty, greasy foods.  Alcohol.  Tobacco.  Overeating.  Gelatin desserts.  Wash your hands well to avoid spreading bacteria and viruses.  Only take over-the-counter or prescription medicines for pain, discomfort, or fever as directed by your caregiver.  Ask your caregiver if you should continue all prescribed and over-the-counter medicines.  Keep all follow-up appointments with your caregiver. SEEK MEDICAL CARE IF:  You have abdominal pain and it increases or stays in one area (localizes).  You have a rash, stiff neck, or severe headache.  You are irritable, sleepy, or difficult to awaken.  You are weak, dizzy, or extremely thirsty. SEEK IMMEDIATE MEDICAL CARE IF:   You are unable to keep fluids down or you get worse despite treatment.  You have frequent episodes of vomiting or diarrhea.  You have blood or green matter (bile) in your vomit.  You have blood in your stool or your stool looks black and tarry.  You have not urinated in 6 to 8 hours, or you have only urinated a small amount of very dark urine.  You have a fever.  You faint. MAKE SURE YOU:   Understand these instructions.  Will watch your condition.  Will get help right away if you are not doing well or get worse. Document Released: 03/15/2005 Document Revised: 06/07/2011 Document Reviewed: 11/02/2010 [employer] Patient Information 2015 [employer]. This information is not intended to replace advice given to you by your health care provider. Make sure you discuss any questions you have with your health care   provider.  

## 2014-03-06 ENCOUNTER — Other Ambulatory Visit: Payer: Self-pay | Admitting: Family Medicine

## 2014-04-24 ENCOUNTER — Other Ambulatory Visit (INDEPENDENT_AMBULATORY_CARE_PROVIDER_SITE_OTHER): Payer: BLUE CROSS/BLUE SHIELD

## 2014-04-24 ENCOUNTER — Other Ambulatory Visit: Payer: Self-pay | Admitting: Family Medicine

## 2014-04-24 DIAGNOSIS — Z0000 Encounter for general adult medical examination without abnormal findings: Secondary | ICD-10-CM

## 2014-04-24 LAB — LIPID PANEL
CHOL/HDL RATIO: 4
Cholesterol: 167 mg/dL (ref 0–200)
HDL: 42.8 mg/dL (ref >39.00)
NonHDL: 124.2
Triglycerides: 206 mg/dL — ABNORMAL HIGH (ref 0.0–149.0)
VLDL: 41.2 mg/dL — ABNORMAL HIGH (ref 0.0–40.0)

## 2014-04-24 LAB — LDL CHOLESTEROL, DIRECT: Direct LDL: 115 mg/dL

## 2014-04-24 LAB — CBC WITH DIFFERENTIAL/PLATELET
Basophils Absolute: 0 10*3/uL (ref 0.0–0.1)
Basophils Relative: 0.4 % (ref 0.0–3.0)
Eosinophils Absolute: 0.2 10*3/uL (ref 0.0–0.7)
Eosinophils Relative: 2.4 % (ref 0.0–5.0)
HEMATOCRIT: 39.6 % (ref 36.0–46.0)
HEMOGLOBIN: 13.4 g/dL (ref 12.0–15.0)
LYMPHS PCT: 35.8 % (ref 12.0–46.0)
Lymphs Abs: 2.8 10*3/uL (ref 0.7–4.0)
MCHC: 33.9 g/dL (ref 30.0–36.0)
MCV: 94.1 fl (ref 78.0–100.0)
MONO ABS: 0.4 10*3/uL (ref 0.1–1.0)
MONOS PCT: 5.8 % (ref 3.0–12.0)
NEUTROS ABS: 4.3 10*3/uL (ref 1.4–7.7)
Neutrophils Relative %: 55.6 % (ref 43.0–77.0)
Platelets: 373 10*3/uL (ref 150.0–400.0)
RBC: 4.21 Mil/uL (ref 3.87–5.11)
RDW: 14.4 % (ref 11.5–15.5)
WBC: 7.8 10*3/uL (ref 4.0–10.5)

## 2014-04-24 LAB — POCT URINALYSIS DIPSTICK
BILIRUBIN UA: NEGATIVE
Glucose, UA: NEGATIVE
KETONES UA: NEGATIVE
Leukocytes, UA: NEGATIVE
NITRITE UA: NEGATIVE
RBC UA: NEGATIVE
Spec Grav, UA: 1.02
Urobilinogen, UA: 0.2
pH, UA: 7.5

## 2014-04-24 LAB — COMPREHENSIVE METABOLIC PANEL
ALT: 39 U/L — ABNORMAL HIGH (ref 0–35)
AST: 37 U/L (ref 0–37)
Albumin: 4.3 g/dL (ref 3.5–5.2)
Alkaline Phosphatase: 85 U/L (ref 39–117)
BUN: 10 mg/dL (ref 6–23)
CALCIUM: 9.4 mg/dL (ref 8.4–10.5)
CO2: 26 meq/L (ref 19–32)
CREATININE: 0.64 mg/dL (ref 0.40–1.20)
Chloride: 104 mEq/L (ref 96–112)
GFR: 106.58 mL/min (ref >60.00)
Glucose, Bld: 88 mg/dL (ref 70–99)
Potassium: 4 mEq/L (ref 3.5–5.1)
SODIUM: 138 meq/L (ref 135–145)
Total Bilirubin: 0.4 mg/dL (ref 0.2–1.2)
Total Protein: 7.4 g/dL (ref 6.0–8.3)

## 2014-04-24 LAB — VITAMIN D 25 HYDROXY (VIT D DEFICIENCY, FRACTURES): VITD: 28.64 ng/mL — ABNORMAL LOW (ref 30.00–100.00)

## 2014-04-24 LAB — TSH: TSH: 1.15 u[IU]/mL (ref 0.35–4.50)

## 2014-04-30 NOTE — Telephone Encounter (Signed)
Call in #[phone] with 5 rf 

## 2014-05-01 ENCOUNTER — Ambulatory Visit (INDEPENDENT_AMBULATORY_CARE_PROVIDER_SITE_OTHER): Payer: BLUE CROSS/BLUE SHIELD | Admitting: Family Medicine

## 2014-05-01 ENCOUNTER — Encounter: Payer: Self-pay | Admitting: Family Medicine

## 2014-05-01 VITALS — BP 142/97 | HR 113 | Temp 98.7°F | Ht 67.0 in | Wt 175.0 lb

## 2014-05-01 DIAGNOSIS — F411 Generalized anxiety disorder: Secondary | ICD-10-CM

## 2014-05-01 DIAGNOSIS — F329 Major depressive disorder, single episode, unspecified: Secondary | ICD-10-CM

## 2014-05-01 DIAGNOSIS — I10 Essential (primary) hypertension: Secondary | ICD-10-CM

## 2014-05-01 DIAGNOSIS — E559 Vitamin D deficiency, unspecified: Secondary | ICD-10-CM

## 2014-05-01 DIAGNOSIS — F32A Depression, unspecified: Secondary | ICD-10-CM

## 2014-05-01 MED ORDER — FLUOXETINE HCL 40 MG PO CAPS: 40.0000 mg | ORAL_CAPSULE | Freq: Every day | ORAL | Status: DC

## 2014-05-01 MED ORDER — HYDROCHLOROTHIAZIDE 25 MG PO TABS: 25.0000 mg | ORAL_TABLET | Freq: Every day | ORAL | Status: DC

## 2014-05-01 MED ORDER — METOPROLOL SUCCINATE ER 50 MG PO TB24: 50.0000 mg | ORAL_TABLET | Freq: Every day | ORAL | Status: DC

## 2014-05-01 MED ORDER — VITAMIN D (ERGOCALCIFEROL) 1.25 MG (50000 UNIT) PO CAPS: 50000.0000 [IU] | ORAL_CAPSULE | ORAL | Status: DC

## 2014-05-01 NOTE — Progress Notes (Signed)
   Subjective:    Patient ID: [name] [name], female    DOB: 08-Dec-1970, 44 y.o.   MRN: [id number]  HPI Here to follow up anxiety and with concerns about her BP. She has been taking Celexa for several years but it is not working as well as it used to. She is interested in trying something new. Also she has been checking her BP for several weeks at work or at her dentist's office and it has been running as high as the 160s over 100s. She feels fine but she does have some mild swelling in the hands and feet. Lastly she has been taking 50,000 units of vitamin D weekly for several years but it remains slightly low. 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    Review of Systems  Constitutional: Negative.   Respiratory: Negative.   Cardiovascular: Positive for leg swelling. Negative for chest pain and palpitations.  Neurological: Negative.   Psychiatric/Behavioral: Positive for decreased concentration. Negative for confusion, dysphoric mood and agitation. The patient is nervous/anxious.        Objective:   Physical Exam  Constitutional: She is oriented to person, place, and time. She appears well-developed and well-nourished.  Cardiovascular: Normal rate, regular rhythm, normal heart sounds and intact distal pulses.   Pulmonary/Chest: Effort normal and breath sounds normal.  Neurological: She is alert and oriented to person, place, and time.  Psychiatric: She has a normal mood and affect. Her behavior is normal. Thought content normal.          Assessment & Plan:  We will switch from Celexa to Prozac 40 mg a day. She will increase the vitamin D capsules to twice weekly. Start on Metoprolol succinate 50 mg daily and HCTZ 25 mg daily. Recheck all this in 3 weeks

## 2014-05-01 NOTE — Progress Notes (Signed)
Pre visit review using our clinic review tool, if applicable. No additional management support is needed unless otherwise documented below in the visit note. 

## 2014-05-02 ENCOUNTER — Telehealth: Payer: Self-pay | Admitting: Family Medicine

## 2014-05-02 ENCOUNTER — Encounter: Payer: Self-pay | Admitting: Family Medicine

## 2014-05-02 DIAGNOSIS — I10 Essential (primary) hypertension: Secondary | ICD-10-CM | POA: Insufficient documentation

## 2014-05-02 DIAGNOSIS — E559 Vitamin D deficiency, unspecified: Secondary | ICD-10-CM | POA: Insufficient documentation

## 2014-05-02 NOTE — Telephone Encounter (Signed)
Pt states she needs a note for work for being out and a "statement of treatment" regarding being put on the bp med and prozac for her employer.  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 She wants a note for all these days due to the issues discussed at appt on wed.

## 2014-05-03 NOTE — Telephone Encounter (Signed)
Pt following up on note. But also wants to know; 1. Pt states she takes an aleve daily for her back and wants to know if she should continue this.  2. Pt wants to know if she can continue to drink her coffee?  She drinks 2-8 cups /day.  It does have caffeine.

## 2014-05-03 NOTE — Telephone Encounter (Signed)
Pt needs answer to question # 1 & 2.

## 2014-05-03 NOTE — Telephone Encounter (Signed)
Please give her a note for work but we cannot divulge her diagnoses or what we gave her unless this is an FMLA situation

## 2014-05-06 NOTE — Telephone Encounter (Signed)
I spoke with pt and AVS is ready, along with note.

## 2014-05-06 NOTE — Telephone Encounter (Signed)
Pt following up on letter request. Would also like for you to run her AVS , that is all she wants as far as a "statement of treatment". Pt said she did not get hers. Will pick up letter and avs all the same time.  (and also the questions answered)

## 2014-05-06 NOTE — Telephone Encounter (Signed)
We cannot print an AVS after the office note has been signed, but I printed out a copy of the office note. Please give this to her along with a work note. As for her questions about drinking coffee and taking Aleve, Yes both are okay.

## 2014-05-07 ENCOUNTER — Other Ambulatory Visit: Payer: Self-pay | Admitting: Family Medicine

## 2014-05-08 ENCOUNTER — Other Ambulatory Visit: Payer: Self-pay | Admitting: Family Medicine

## 2014-05-14 ENCOUNTER — Encounter: Payer: Self-pay | Admitting: Family Medicine

## 2014-05-14 MED ORDER — LORAZEPAM 0.5 MG PO TABS: 0.5000 mg | ORAL_TABLET | Freq: Three times a day (TID) | ORAL | Status: DC | PRN

## 2014-05-14 NOTE — Telephone Encounter (Signed)
Call in Lorazepam 0.5 mg to take TID prn anxiety, #90 with 5 rf

## 2014-05-22 ENCOUNTER — Telehealth: Payer: Self-pay | Admitting: Family Medicine

## 2014-05-22 NOTE — Telephone Encounter (Signed)
Okay to schedule

## 2014-05-22 NOTE — Telephone Encounter (Signed)
Pt called to ask if Dr Sarajane Jews will see her tomorrow.She said he started her on some new medicine FLUoxetine (PROZAC) 40 MG capsule and she is feeling lethargic. May I use the same day appt slot.

## 2014-05-23 NOTE — Telephone Encounter (Signed)
Pt has been sch for tomorrow °

## 2014-05-24 ENCOUNTER — Ambulatory Visit (INDEPENDENT_AMBULATORY_CARE_PROVIDER_SITE_OTHER): Payer: BLUE CROSS/BLUE SHIELD | Admitting: Family Medicine

## 2014-05-24 ENCOUNTER — Encounter: Payer: Self-pay | Admitting: Family Medicine

## 2014-05-24 VITALS — BP 134/87 | HR 75 | Temp 98.5°F | Ht 67.0 in | Wt 180.0 lb

## 2014-05-24 DIAGNOSIS — I10 Essential (primary) hypertension: Secondary | ICD-10-CM

## 2014-05-24 DIAGNOSIS — F329 Major depressive disorder, single episode, unspecified: Secondary | ICD-10-CM

## 2014-05-24 DIAGNOSIS — J0100 Acute maxillary sinusitis, unspecified: Secondary | ICD-10-CM

## 2014-05-24 DIAGNOSIS — F411 Generalized anxiety disorder: Secondary | ICD-10-CM

## 2014-05-24 DIAGNOSIS — F32A Depression, unspecified: Secondary | ICD-10-CM

## 2014-05-24 MED ORDER — FLUOXETINE HCL 20 MG PO TABS: 20.0000 mg | ORAL_TABLET | Freq: Every day | ORAL | Status: DC

## 2014-05-24 MED ORDER — AZITHROMYCIN 250 MG PO TABS: ORAL_TABLET | ORAL | Status: DC

## 2014-05-24 NOTE — Progress Notes (Signed)
   Subjective:    Patient ID: [name] [name], female    DOB: Apr 30, 1970, 44 y.o.   MRN: [id number]  HPI Here to follow up on HTN and depression with anxiety. She has been taking Metorpolol and HCTZ and her BP has normalized nicely. She has also switched from Celexa to Prozac 40 mg daily. For the first week she felt better as far as her moods, but then she started to feel more anxious than before. Now she feels like she may start crying for no reason. Also she has had sinus pressure and PND for a week. No fever or cough.    Review of Systems  Constitutional: Negative.   HENT: Positive for congestion, postnasal drip and sinus pressure.   Eyes: Negative.   Respiratory: Negative.   Psychiatric/Behavioral: Positive for dysphoric mood and agitation. The patient is nervous/anxious.        Objective:   Physical Exam  Constitutional: She appears well-developed and well-nourished.  HENT:  Right Ear: External ear normal.  Left Ear: External ear normal.  Nose: Nose normal.  Mouth/Throat: Oropharynx is clear and moist.  Eyes: Conjunctivae are normal.  Pulmonary/Chest: Effort normal and breath sounds normal.  Lymphadenopathy:    She has no cervical adenopathy.  Psychiatric: She has a normal mood and affect. Her behavior is normal. Thought content normal.          Assessment & Plan:  Her HTN is now stable. She will decrease the dose of Prozac to 20 mg daily. Treat the sinusitis with a Zpack. She was written out of work from 05-20-14 until 05-27-14.

## 2014-05-24 NOTE — Progress Notes (Signed)
Pre visit review using our clinic review tool, if applicable. No additional management support is needed unless otherwise documented below in the visit note. 

## 2014-05-28 ENCOUNTER — Encounter: Payer: Self-pay | Admitting: Family Medicine

## 2014-05-29 ENCOUNTER — Encounter: Payer: Self-pay | Admitting: Family Medicine

## 2014-05-29 NOTE — Telephone Encounter (Signed)
Tell her to stop the Prozac and start on Wellbutrin XL 150 mg daily. Cal in #30 with 2 rf

## 2014-05-30 MED ORDER — BUPROPION HCL ER (XL) 150 MG PO TB24: 150.0000 mg | ORAL_TABLET | Freq: Every day | ORAL | Status: DC

## 2014-05-30 NOTE — Telephone Encounter (Signed)
New script was sent to [facility] and I spoke with pt.

## 2014-05-30 NOTE — Telephone Encounter (Signed)
New script was sent to [facility] and I spoke with pt, see previous note.

## 2014-06-04 ENCOUNTER — Encounter: Payer: Self-pay | Admitting: Family Medicine

## 2014-06-04 ENCOUNTER — Ambulatory Visit (INDEPENDENT_AMBULATORY_CARE_PROVIDER_SITE_OTHER): Payer: BLUE CROSS/BLUE SHIELD | Admitting: Family Medicine

## 2014-06-04 VITALS — BP 134/87 | HR 70 | Temp 98.7°F | Ht 67.0 in | Wt 176.0 lb

## 2014-06-04 DIAGNOSIS — F329 Major depressive disorder, single episode, unspecified: Secondary | ICD-10-CM

## 2014-06-04 DIAGNOSIS — F411 Generalized anxiety disorder: Secondary | ICD-10-CM

## 2014-06-04 DIAGNOSIS — F32A Depression, unspecified: Secondary | ICD-10-CM

## 2014-06-04 MED ORDER — BUPROPION HCL ER (XL) 300 MG PO TB24: 300.0000 mg | ORAL_TABLET | Freq: Every day | ORAL | Status: DC

## 2014-06-04 NOTE — Progress Notes (Signed)
Pre visit review using our clinic review tool, if applicable. No additional management support is needed unless otherwise documented below in the visit note. 

## 2014-06-04 NOTE — Progress Notes (Signed)
   Subjective:    Patient ID: [name] [name], female    DOB: 02-09-1971, 44 y.o.   MRN: [id number]  HPI Here to follow up on depression and anxiety. She has been taking Wellbutrin XL 150 mg daily after she stopped taking prozac, and she likes this new medication much better. No side effects like she had on Prozac, no nightmares and no mood swings. She is still somewhat depressed however.    Review of Systems  Constitutional: Negative.   Neurological: Negative.   Psychiatric/Behavioral: Positive for dysphoric mood. Negative for behavioral problems, confusion, decreased concentration and agitation. The patient is nervous/anxious.        Objective:   Physical Exam  Constitutional: She is oriented to person, place, and time. She appears well-developed and well-nourished.  Neurological: She is alert and oriented to person, place, and time.  Psychiatric: She has a normal mood and affect. Her behavior is normal. Thought content normal.          Assessment & Plan:  We will increase the Wellbutrin XL to 300 mg daily. Written out of work from 05-27-14 until 06-10-14, and we wrote a note so that her hours would be limited to no more than 8 hours a day and no more than 40 hours a week. Recheck in 2 weeks

## 2014-06-10 ENCOUNTER — Other Ambulatory Visit: Payer: Self-pay | Admitting: Family Medicine

## 2014-06-12 ENCOUNTER — Encounter: Payer: Self-pay | Admitting: Family Medicine

## 2014-06-12 MED ORDER — CYCLOBENZAPRINE HCL 5 MG PO TABS
5.0000 mg | ORAL_TABLET | Freq: Three times a day (TID) | ORAL | Status: DC | PRN
Start: 2014-06-12 — End: 2015-05-22

## 2014-06-12 NOTE — Telephone Encounter (Signed)
Call in Cyclobenzaprine 5 mg tid, #90 with 5 rf

## 2014-08-23 ENCOUNTER — Other Ambulatory Visit: Payer: Self-pay | Admitting: Family Medicine

## 2014-08-23 NOTE — Telephone Encounter (Signed)
Sent to the pharmacy by e-scribe.  Last OV states pt is doing well on medication.  Recent lab work in Jan 2016.

## 2014-08-25 ENCOUNTER — Other Ambulatory Visit: Payer: Self-pay | Admitting: Family Medicine

## 2014-09-15 ENCOUNTER — Encounter (HOSPITAL_BASED_OUTPATIENT_CLINIC_OR_DEPARTMENT_OTHER): Payer: Self-pay | Admitting: *Deleted

## 2014-09-15 ENCOUNTER — Emergency Department (HOSPITAL_BASED_OUTPATIENT_CLINIC_OR_DEPARTMENT_OTHER)
Admission: EM | Admit: 2014-09-15 | Discharge: 2014-09-15 | Disposition: A | Discharge status: Home / Self Care | Payer: BLUE CROSS/BLUE SHIELD | Attending: Emergency Medicine | Admitting: Emergency Medicine

## 2014-09-15 DIAGNOSIS — R0989 Other specified symptoms and signs involving the circulatory and respiratory systems: Secondary | ICD-10-CM

## 2014-09-15 DIAGNOSIS — F329 Major depressive disorder, single episode, unspecified: Secondary | ICD-10-CM | POA: Insufficient documentation

## 2014-09-15 DIAGNOSIS — R1310 Dysphagia, unspecified: Secondary | ICD-10-CM | POA: Insufficient documentation

## 2014-09-15 DIAGNOSIS — J45909 Unspecified asthma, uncomplicated: Secondary | ICD-10-CM | POA: Insufficient documentation

## 2014-09-15 DIAGNOSIS — F419 Anxiety disorder, unspecified: Secondary | ICD-10-CM | POA: Insufficient documentation

## 2014-09-15 DIAGNOSIS — Z791 Long term (current) use of non-steroidal anti-inflammatories (NSAID): Secondary | ICD-10-CM | POA: Insufficient documentation

## 2014-09-15 DIAGNOSIS — Z872 Personal history of diseases of the skin and subcutaneous tissue: Secondary | ICD-10-CM | POA: Insufficient documentation

## 2014-09-15 DIAGNOSIS — Z720 Tobacco use: Secondary | ICD-10-CM | POA: Insufficient documentation

## 2014-09-15 DIAGNOSIS — Z7951 Long term (current) use of inhaled steroids: Secondary | ICD-10-CM | POA: Insufficient documentation

## 2014-09-15 DIAGNOSIS — M797 Fibromyalgia: Secondary | ICD-10-CM | POA: Insufficient documentation

## 2014-09-15 DIAGNOSIS — K219 Gastro-esophageal reflux disease without esophagitis: Secondary | ICD-10-CM | POA: Insufficient documentation

## 2014-09-15 DIAGNOSIS — Z79899 Other long term (current) drug therapy: Secondary | ICD-10-CM | POA: Insufficient documentation

## 2014-09-15 NOTE — Discharge Instructions (Signed)
No foreign body was seen on your right tonsil.  Please continue salt water gargles.  Recheck with ent if foreign body sensation continues over 24-48 hours.  Return if any swelling,difficulty breathing, swallowing, or speaking.

## 2014-09-15 NOTE — ED Notes (Signed)
Patient states that her throat is sore due to a popcorn hull being stuck in her throat

## 2014-09-15 NOTE — ED Provider Notes (Signed)
CSN: [id number]     Arrival date & time 09/15/14  8563 History   First MD Initiated Contact with Patient 09/15/14 [phone]     Chief Complaint  Patient presents with  . Sore Throat     (Consider location/radiation/quality/duration/timing/severity/associated sxs/prior Treatment) HPI 44 year old female presents today complaining of foreign body sensation in the right side of her tonsils. She states that she could see a popcorn stuck there. She cannot see it any longer but continues have the foreign body sensation. She feels like it is somewhat swollen and somewhat tender when she swallows. She has not had any previous foreign bodies in her throat. Past Medical History  Diagnosis Date  . ANXIETY 08/22/2007  . NICOTINE ADDICTION 06/26/2007  . DEPRESSION 08/22/2007  . ALLERGIC RHINITIS 06/26/2007  . HYPERGLYCEMIA 08/22/2007  . NONSPECIFIC ABNORM RESULTS THYROID FUNCT STUDY 08/22/2007  . Spontaneous pneumothorax   . GERD (gastroesophageal reflux disease)   . Sinusitis   . ASTHMA 06/26/2007    last attack 1 mo ago  . ASTHMA, WITH ACUTE EXACERBATION 07/26/2007  . MYALGIA 04/15/2010  . Fibromyalgia   . DM (dermatomyositis)     sees Dr. Jerene Pitch (Rheum at [facility]) and Dr. Saundra Shelling (Derm at [facility])   Past Surgical History  Procedure Laterality Date  . Fallopian tubectomy Right 2005  . Cervical conization w/bx    . Lung surgery    . Knee surgery    . Lumbar fusion  08/23/2013    L4 L5   DR BROOKS   No family history on file. History  Substance Use Topics  . Smoking status: Current Every Day Smoker -- 27 years    Types: Cigarettes    Last Attempt to Quit: 08/16/2013  . Smokeless tobacco: Never Used     Comment: less than a pack per day  . Alcohol Use: No   OB History    No data available     Review of Systems  All other systems reviewed and are negative.     Allergies  Codeine  Home Medications   Prior to Admission medications   Medication Sig Start Date End Date Taking?  Authorizing Provider  azithromycin (ZITHROMAX Z-PAK) 250 MG tablet Take as directed Patient not taking: Reported on 06/04/2014 05/24/14   Laurey Morale, MD  buPROPion (WELLBUTRIN XL) 300 MG 24 hr tablet Take 1 tablet (300 mg total) by mouth daily. 06/04/14   Laurey Morale, MD  cetirizine (ZYRTEC) 10 MG tablet Take 10 mg by mouth at bedtime.    Historical Provider, MD  cyclobenzaprine (FLEXERIL) 5 MG tablet Take 1 tablet (5 mg total) by mouth 3 (three) times daily as needed for muscle spasms. 06/12/14   Laurey Morale, MD  fluticasone (FLONASE) 50 MCG/ACT nasal spray Place 1 spray into both nostrils every morning.    Historical Provider, MD  hydrochlorothiazide (HYDRODIURIL) 25 MG tablet TAKE 1 TABLET BY MOUTH EVERY DAY 08/27/14   Laurey Morale, MD  hydroxychloroquine (PLAQUENIL) 200 MG tablet Take 200 mg by mouth 2 (two) times daily.     Historical Provider, MD  ibuprofen (ADVIL,MOTRIN) 200 MG tablet Take 200 mg by mouth every 6 (six) hours as needed.    Historical Provider, MD  LORazepam (ATIVAN) 0.5 MG tablet Take 1 tablet (0.5 mg total) by mouth 3 (three) times daily as needed for anxiety. 05/14/14   Laurey Morale, MD  metoprolol succinate (TOPROL-XL) 50 MG 24 hr tablet TAKE 1 TABLET BY MOUTH EVERY DAY WITH  OR IMMEDIATELY FOLLOWING A MEAL 08/23/14   Laurey Morale, MD  naproxen sodium (ANAPROX) 220 MG tablet Take 220 mg by mouth daily.    Historical Provider, MD  omeprazole (PRILOSEC) 40 MG capsule Take 40 mg by mouth daily.    Historical Provider, MD  varenicline (CHANTIX) 1 MG tablet Take 1 mg by mouth 2 (two) times daily.    Historical Provider, MD  VENTOLIN HFA 108 (90 BASE) MCG/ACT inhaler INHALE 2 PUFFS EVERY 4 HOURS AS NEEDED WHEEZING OR FOR SHORTNESS OF BREATH 05/08/14   Laurey Morale, MD  VENTOLIN HFA 108 (90 BASE) MCG/ACT inhaler INHALE 2 PUFFS EVERY 4 HOURS AS NEEDED WHEEZING OR FOR SHORTNESS OF BREATH 06/10/14   Laurey Morale, MD  Vitamin D, Ergocalciferol, (DRISDOL) 50000 UNITS CAPS capsule Take  1 capsule (50,000 Units total) by mouth 2 (two) times a week. 05/01/14   Laurey Morale, MD   BP 149/100 mmHg  Pulse 99  Temp(Src) 98.4 F (36.9 C) (Oral)  Resp 18  Ht 5\' 7"  (1.702 m)  Wt 166 lb (75.297 kg)  BMI 25.99 kg/m2  SpO2 98% Physical Exam  Constitutional: She appears well-developed and well-nourished.  HENT:  Head: Normocephalic and atraumatic.  Right Ear: External ear normal.  Left Ear: External ear normal.  Nose: Nose normal.  Mouth/Throat: Uvula is midline and oropharynx is clear and moist. No oropharyngeal exudate, posterior oropharyngeal edema, posterior oropharyngeal erythema or tonsillar abscesses.  No foreign bodies or exudate noted of tonsils.  Eyes: Pupils are equal, round, and reactive to light.  Neck: Normal range of motion. Neck supple. No JVD present. No tracheal deviation present. No thyromegaly present.  Cardiovascular: Normal rate.   Pulmonary/Chest: Effort normal. No stridor.  Abdominal: Soft.  Lymphadenopathy:    She has no cervical adenopathy.  Nursing note and vitals reviewed.   ED Course  Procedures (including critical care time) Labs Review Labs Reviewed - No data to display  Imaging Review No results found.   EKG Interpretation None      MDM   Final diagnoses:  Foreign body sensation in throat    Cetacaine spray used to anesthetize the oropharynx to allow for exam. No evidence of foreign body seen. I discussed with the patient that this could have already passed and left foreign body sensation. She is referred to ENT to follow-up tomorrow she continues to feel pain and swelling in this area    Pattricia Boss, MD 09/17/14 1457

## 2015-04-10 ENCOUNTER — Other Ambulatory Visit: Payer: Self-pay | Admitting: Family Medicine

## 2015-04-10 MED ORDER — ALBUTEROL SULFATE HFA 108 (90 BASE) MCG/ACT IN AERS: INHALATION_SPRAY | RESPIRATORY_TRACT | Status: DC

## 2015-04-10 NOTE — Telephone Encounter (Signed)
Pt will make an appt soon however pt needs a refill on ventolin inhaler sent to [facility] on [address]. Pt just got insurance.

## 2015-04-10 NOTE — Telephone Encounter (Signed)
Rx was send to the pharmacy

## 2015-05-20 ENCOUNTER — Encounter: Payer: Self-pay | Admitting: Adult Health

## 2015-05-20 ENCOUNTER — Ambulatory Visit (INDEPENDENT_AMBULATORY_CARE_PROVIDER_SITE_OTHER): Payer: Managed Care, Other (non HMO) | Admitting: Adult Health

## 2015-05-20 VITALS — BP 120/96 | HR 81 | Temp 98.9°F | Ht 67.0 in | Wt 157.9 lb

## 2015-05-20 DIAGNOSIS — J209 Acute bronchitis, unspecified: Secondary | ICD-10-CM

## 2015-05-20 MED ORDER — PREDNISONE 10 MG PO TABS: 10.0000 mg | ORAL_TABLET | Freq: Every day | ORAL | Status: DC

## 2015-05-20 NOTE — Progress Notes (Signed)
Subjective:    Patient ID: [name] [name], female    DOB: [birth year], 45 y.o.   MRN: [id number]  HPI  45 year old female who presents to the office today for dry cough, chest congestion, sore throat ( feels like pins and needles) and has trouble swallowing.   She does report that she has intermittnet chills and diaphoresis but this has since resolved  Denies n/v/d/  Has been using her inhaler, which does not seem to be helping.   Review of Systems  Constitutional: Positive for fever, chills, diaphoresis and fatigue.  HENT: Positive for sore throat. Negative for ear pain, facial swelling, postnasal drip, rhinorrhea and sinus pressure.   Eyes: Negative.   Respiratory: Positive for cough and wheezing. Negative for shortness of breath.   Cardiovascular: Negative.   Musculoskeletal: Negative.   Skin: Negative.   Neurological: Negative.   All other systems reviewed and are negative.  Past Medical History  Diagnosis Date  . ANXIETY 08/22/2007  . NICOTINE ADDICTION 06/26/2007  . DEPRESSION 08/22/2007  . ALLERGIC RHINITIS 06/26/2007  . HYPERGLYCEMIA 08/22/2007  . NONSPECIFIC ABNORM RESULTS THYROID FUNCT STUDY 08/22/2007  . Spontaneous pneumothorax   . GERD (gastroesophageal reflux disease)   . Sinusitis   . ASTHMA 06/26/2007    last attack 1 mo ago  . ASTHMA, WITH ACUTE EXACERBATION 07/26/2007  . MYALGIA 04/15/2010  . Fibromyalgia   . DM (dermatomyositis)     sees Dr. Jerene Pitch (R[facility]um at [facility]) and Dr. Saundra Shelling (Derm at [facility])    Social History   Social History  . Marital Status: Divorced    Spouse Name: N/A  . Number of Children: N/A  . Years of Education: N/A   Occupational History  . Not on file.   Social History Main Topics  . Smoking status: Current Every Day Smoker -- 27 years    Types: Cigarettes    Last Attempt to Quit: 08/16/2013  . Smokeless tobacco: Never Used     Comment: less than a pack per day  . Alcohol Use: No  . Drug Use: No  . Sexual  Activity: Not on file   Other Topics Concern  . Not on file   Social History Narrative    Past Surgical History  Procedure Laterality Date  . Fallopian tubectomy Right 2005  . Cervical conization w/bx    . Lung surgery    . Knee surgery    . Lumbar fusion  08/23/2013    L4 L5   DR BROOKS    No family history on file.  Allergies  Allergen Reactions  . Codeine Hives and Nausea And Vomiting    Current Outpatient Prescriptions on File Prior to Visit  Medication Sig Dispense Refill  . albuterol (VENTOLIN HFA) 108 (90 Base) MCG/ACT inhaler INHALE 2 PUFFS EVERY 4 HOURS AS NEEDED WHEEZING OR FOR SHORTNESS OF BREATH 18 Inhaler 6  . cyclobenzaprine (FLEXERIL) 5 MG tablet Take 1 tablet (5 mg total) by mouth 3 (three) times daily as needed for muscle spasms. 90 tablet 5  . LORazepam (ATIVAN) 0.5 MG tablet Take 1 tablet (0.5 mg total) by mouth 3 (three) times daily as needed for anxiety. 90 tablet 5  . naproxen sodium (ANAPROX) 220 MG tablet Take 220 mg by mouth daily.     No current facility-administered medications on file prior to visit.    BP 120/96 mmHg  Pulse 81  Temp(Src) 98.9 F (37.2 C) (Oral)  Ht 5\' 7"  (1.702 m)  Wt 157  lb 14.4 oz (71.623 kg)  BMI 24.72 kg/m2  SpO2 97%       Objective:   Physical Exam  Constitutional: She is oriented to person, place, and time. She appears well-developed and well-nourished. No distress.  HENT:  Head: Normocephalic and atraumatic.  Right Ear: External ear normal.  Left Ear: External ear normal.  Nose: Nose normal.  Mouth/Throat: Oropharynx is clear and moist. No oropharyngeal exudate.  TM's visualized. No signs of infection. No redness, swelling or exudate to throat  Eyes: Conjunctivae and EOM are normal. Pupils are equal, round, and reactive to light. Right eye exhibits no discharge. Left eye exhibits no discharge.  Neck: Normal range of motion. Neck supple. No thyromegaly present.  Cardiovascular: Normal rate, regular rhythm,  normal heart sounds and intact distal pulses.  Exam reveals no gallop and no friction rub.   No murmur heard. Pulmonary/Chest: Effort normal. No respiratory distress. She has wheezes (left mid). She has no rales. She exhibits no tenderness.  Lymphadenopathy:    She has no cervical adenopathy.  Neurological: She is alert and oriented to person, place, and time.  Skin: Skin is warm and dry. No rash noted. She is not diaphoretic. No erythema. No pallor.  Psychiatric: She has a normal mood and affect. Her behavior is normal. Judgment and thought content normal.  Nursing note and vitals reviewed.     Assessment & Plan:  1. Acute bronchitis, unspecified organism - predniSONE (DELTASONE) 10 MG tablet; Take 1 tablet (10 mg total) by mouth daily with breakfast.  Dispense: 21 tablet; Refill: 0 40 mg x 3 days, 20 mg x 3 days, 10 mg x 3 days - Mucinex cough  - Follow up if no improvement.

## 2015-05-20 NOTE — Progress Notes (Signed)
Pre visit review using our clinic review tool, if applicable. No additional management support is needed unless otherwise documented below in the visit note. 

## 2015-05-20 NOTE — Patient Instructions (Addendum)
It was great meeting you today!  Your exam is consistent with bronchitis.   I have sent in a prescription for prednisone, take these as directed:  40 mg x 3 days 20 mg x 3 days 10 mg x 3 days.   You can also use Mucinex to help with the cough.   Follow up if no improvement.       Acute Bronchitis Bronchitis is inflammation of the airways that extend from the windpipe into the lungs (bronchi). The inflammation often causes mucus to develop. This leads to a cough, which is the most common symptom of bronchitis.  In acute bronchitis, the condition usually develops suddenly and goes away over time, usually in a couple weeks. Smoking, allergies, and asthma can make bronchitis worse. Repeated episodes of bronchitis may cause further lung problems.  CAUSES Acute bronchitis is most often caused by the same virus that causes a cold. The virus can spread from person to person (contagious) through coughing, sneezing, and touching contaminated objects. SIGNS AND SYMPTOMS   Cough.   Fever.   Coughing up mucus.   Body aches.   Chest congestion.   Chills.   Shortness of breath.   Sore throat.  DIAGNOSIS  Acute bronchitis is usually diagnosed through a physical exam. Your health care provider will also ask you questions about your medical history. Tests, such as chest X-rays, are sometimes done to rule out other conditions.  TREATMENT  Acute bronchitis usually goes away in a couple weeks. Oftentimes, no medical treatment is necessary. Medicines are sometimes given for relief of fever or cough. Antibiotic medicines are usually not needed but may be prescribed in certain situations. In some cases, an inhaler may be recommended to help reduce shortness of breath and control the cough. A cool mist vaporizer may also be used to help thin bronchial secretions and make it easier to clear the chest.  HOME CARE INSTRUCTIONS  Get plenty of rest.   Drink enough fluids to keep your urine  clear or pale yellow (unless you have a medical condition that requires fluid restriction). Increasing fluids may help thin your respiratory secretions (sputum) and reduce chest congestion, and it will prevent dehydration.   Take medicines only as directed by your health care provider.  If you were prescribed an antibiotic medicine, finish it all even if you start to feel better.  Avoid smoking and secondhand smoke. Exposure to cigarette smoke or irritating chemicals will make bronchitis worse. If you are a smoker, consider using nicotine gum or skin patches to help control withdrawal symptoms. Quitting smoking will help your lungs heal faster.   Reduce the chances of another bout of acute bronchitis by washing your hands frequently, avoiding people with cold symptoms, and trying not to touch your hands to your mouth, nose, or eyes.   Keep all follow-up visits as directed by your health care provider.  SEEK MEDICAL CARE IF: Your symptoms do not improve after 1 week of treatment.  SEEK IMMEDIATE MEDICAL CARE IF:  You develop an increased fever or chills.   You have chest pain.   You have severe shortness of breath.  You have bloody sputum.   You develop dehydration.  You faint or repeatedly feel like you are going to pass out.  You develop repeated vomiting.  You develop a severe headache. MAKE SURE YOU:   Understand these instructions.  Will watch your condition.  Will get help right away if you are not doing well or get worse.  This information is not intended to replace advice given to you by your health care provider. Make sure you discuss any questions you have with your health care provider.   Document Released: 04/22/2004 Document Revised: 04/05/2014 Document Reviewed: 09/05/2012 Elsevier Interactive Patient Education [employer].

## 2015-05-22 ENCOUNTER — Ambulatory Visit (INDEPENDENT_AMBULATORY_CARE_PROVIDER_SITE_OTHER): Payer: Managed Care, Other (non HMO) | Admitting: Family Medicine

## 2015-05-22 ENCOUNTER — Encounter: Payer: Self-pay | Admitting: Family Medicine

## 2015-05-22 VITALS — BP 165/93 | HR 65 | Temp 98.8°F | Ht 67.0 in | Wt 157.0 lb

## 2015-05-22 DIAGNOSIS — Z0000 Encounter for general adult medical examination without abnormal findings: Secondary | ICD-10-CM

## 2015-05-22 LAB — POC URINALSYSI DIPSTICK (AUTOMATED)
BILIRUBIN UA: NEGATIVE
GLUCOSE UA: NEGATIVE
Ketones, UA: NEGATIVE
LEUKOCYTES UA: NEGATIVE
NITRITE UA: NEGATIVE
Protein, UA: NEGATIVE
Spec Grav, UA: 1.025
UROBILINOGEN UA: 0.2
pH, UA: 6

## 2015-05-22 LAB — BASIC METABOLIC PANEL
BUN: 13 mg/dL (ref 6–23)
CALCIUM: 9.3 mg/dL (ref 8.4–10.5)
CO2: 28 mEq/L (ref 19–32)
Chloride: 103 mEq/L (ref 96–112)
Creatinine, Ser: 0.65 mg/dL (ref 0.40–1.20)
GFR: 104.19 mL/min (ref >60.00)
GLUCOSE: 96 mg/dL (ref 70–99)
POTASSIUM: 4.5 meq/L (ref 3.5–5.1)
SODIUM: 139 meq/L (ref 135–145)

## 2015-05-22 LAB — HEPATIC FUNCTION PANEL
ALBUMIN: 4.4 g/dL (ref 3.5–5.2)
ALK PHOS: 68 U/L (ref 39–117)
ALT: 15 U/L (ref 0–35)
AST: 14 U/L (ref 0–37)
BILIRUBIN DIRECT: 0.1 mg/dL (ref 0.0–0.3)
BILIRUBIN TOTAL: 0.4 mg/dL (ref 0.2–1.2)
Total Protein: 7.2 g/dL (ref 6.0–8.3)

## 2015-05-22 LAB — CBC WITH DIFFERENTIAL/PLATELET
BASOS ABS: 0 10*3/uL (ref 0.0–0.1)
BASOS PCT: 0.3 % (ref 0.0–3.0)
EOS ABS: 0 10*3/uL (ref 0.0–0.7)
Eosinophils Relative: 0.3 % (ref 0.0–5.0)
HEMATOCRIT: 42.5 % (ref 36.0–46.0)
HEMOGLOBIN: 14.3 g/dL (ref 12.0–15.0)
LYMPHS PCT: 12.5 % (ref 12.0–46.0)
Lymphs Abs: 1.8 10*3/uL (ref 0.7–4.0)
MCHC: 33.6 g/dL (ref 30.0–36.0)
MCV: 100.9 fl — AB (ref 78.0–100.0)
MONOS PCT: 2.1 % — AB (ref 3.0–12.0)
Monocytes Absolute: 0.3 10*3/uL (ref 0.1–1.0)
NEUTROS ABS: 12 10*3/uL — AB (ref 1.4–7.7)
NEUTROS PCT: 84.8 % — AB (ref 43.0–77.0)
Platelets: 373 10*3/uL (ref 150.0–400.0)
RBC: 4.21 Mil/uL (ref 3.87–5.11)
RDW: 13.4 % (ref 11.5–15.5)
WBC: 14.2 10*3/uL — AB (ref 4.0–10.5)

## 2015-05-22 LAB — LIPID PANEL
CHOL/HDL RATIO: 3
CHOLESTEROL: 171 mg/dL (ref 0–200)
HDL: 61.5 mg/dL (ref >39.00)
LDL CALC: 82 mg/dL (ref 0–99)
NONHDL: 109.92
Triglycerides: 138 mg/dL (ref 0.0–149.0)
VLDL: 27.6 mg/dL (ref 0.0–40.0)

## 2015-05-22 LAB — VITAMIN D 25 HYDROXY (VIT D DEFICIENCY, FRACTURES): VITD: 64.19 ng/mL (ref 30.00–100.00)

## 2015-05-22 LAB — TSH: TSH: 0.83 u[IU]/mL (ref 0.35–4.50)

## 2015-05-22 MED ORDER — CYCLOBENZAPRINE HCL 5 MG PO TABS: 5.0000 mg | ORAL_TABLET | Freq: Three times a day (TID) | ORAL | Status: DC | PRN

## 2015-05-22 MED ORDER — METOPROLOL SUCCINATE ER 50 MG PO TB24: 50.0000 mg | ORAL_TABLET | Freq: Every day | ORAL | Status: DC

## 2015-05-22 MED ORDER — LORAZEPAM 0.5 MG PO TABS: 0.5000 mg | ORAL_TABLET | Freq: Three times a day (TID) | ORAL | Status: DC | PRN

## 2015-05-22 NOTE — Progress Notes (Signed)
Pre visit review using our clinic review tool, if applicable. No additional management support is needed unless otherwise documented below in the visit note. 

## 2015-05-22 NOTE — Progress Notes (Signed)
   Subjective:    Patient ID: [name] [name], female    DOB: 30-Oct-1970, 45 y.o.   MRN: [id number]  HPI 45 yr old female for a cpx. She feels well. She has lost a lot of weight. 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    Review of Systems  Constitutional: Negative.   HENT: Negative.   Eyes: Negative.   Respiratory: Negative.   Cardiovascular: Negative.   Gastrointestinal: Negative.   Genitourinary: Negative for dysuria, urgency, frequency, hematuria, flank pain, decreased urine volume, enuresis, difficulty urinating, pelvic pain and dyspareunia.  Musculoskeletal: Negative.   Skin: Negative.   Neurological: Negative.   Psychiatric/Behavioral: Negative.        Objective:   Physical Exam  Constitutional: She is oriented to person, place, and time. She appears well-developed and well-nourished. No distress.  HENT:  Head: Normocephalic and atraumatic.  Right Ear: External ear normal.  Left Ear: External ear normal.  Nose: Nose normal.  Mouth/Throat: Oropharynx is clear and moist. No oropharyngeal exudate.  Eyes: Conjunctivae and EOM are normal. Pupils are equal, round, and reactive to light. No scleral icterus.  Neck: Normal range of motion. Neck supple. No JVD present. No thyromegaly present.  Cardiovascular: Normal rate, regular rhythm, normal heart sounds and intact distal pulses.  Exam reveals no gallop and no friction rub.   No murmur heard. Pulmonary/Chest: Effort normal and breath sounds normal. No respiratory distress. She has no wheezes. She has no rales. She exhibits no tenderness.  Abdominal: Soft. Bowel sounds are normal. She exhibits no distension and no mass. There is no tenderness. There is no rebound and no guarding.  Musculoskeletal: Normal range of motion. She exhibits no edema or tenderness.  Lymphadenopathy:    She has no cervical adenopathy.  Neurological: She is alert and oriented to person, place, and time. She has normal reflexes.  No cranial nerve deficit. She exhibits normal muscle tone. Coordination normal.  Skin: Skin is warm and dry. No rash noted. No erythema.  Psychiatric: She has a normal mood and affect. Her behavior is normal. Judgment and thought content normal.          Assessment & Plan:  Well exam. Get fasting labs. We discussed diet and exercise. Get back on Metoprolol succinate 50 mgt daily for the HTN.

## 2015-05-29 ENCOUNTER — Encounter: Payer: Self-pay | Admitting: Family Medicine

## 2015-05-29 NOTE — Telephone Encounter (Signed)
I spoke with pt  

## 2015-05-30 ENCOUNTER — Encounter: Payer: Self-pay | Admitting: Family Medicine

## 2015-05-30 NOTE — Telephone Encounter (Signed)
Pt wants to make sure she is taking the correct dosage of below medications, she recently had labs done.

## 2015-05-30 NOTE — Telephone Encounter (Signed)
noted 

## 2015-06-06 ENCOUNTER — Encounter: Payer: Self-pay | Admitting: Family Medicine

## 2015-06-06 MED ORDER — BUPROPION HCL ER (XL) 150 MG PO TB24: 150.0000 mg | ORAL_TABLET | Freq: Every day | ORAL | Status: DC

## 2015-06-06 NOTE — Telephone Encounter (Signed)
Call in Wellbutrin XL 150 mg daily, #30 with 2 rf

## 2015-07-16 ENCOUNTER — Ambulatory Visit (INDEPENDENT_AMBULATORY_CARE_PROVIDER_SITE_OTHER)
Admission: RE | Admit: 2015-07-16 | Discharge: 2015-07-16 | Disposition: A | Discharge status: Home / Self Care | Payer: Managed Care, Other (non HMO) | Source: Ambulatory Visit | Attending: Family Medicine | Admitting: Family Medicine

## 2015-07-16 ENCOUNTER — Ambulatory Visit (INDEPENDENT_AMBULATORY_CARE_PROVIDER_SITE_OTHER): Payer: Managed Care, Other (non HMO) | Admitting: Family Medicine

## 2015-07-16 ENCOUNTER — Encounter: Payer: Self-pay | Admitting: Family Medicine

## 2015-07-16 VITALS — BP 147/90 | HR 73 | Temp 98.6°F | Ht 67.0 in | Wt 158.0 lb

## 2015-07-16 DIAGNOSIS — R109 Unspecified abdominal pain: Secondary | ICD-10-CM | POA: Diagnosis not present

## 2015-07-16 LAB — POC URINALSYSI DIPSTICK (AUTOMATED)
BILIRUBIN UA: NEGATIVE
GLUCOSE UA: NEGATIVE
KETONES UA: NEGATIVE
LEUKOCYTES UA: NEGATIVE
NITRITE UA: NEGATIVE
PH UA: 7.5
Protein, UA: NEGATIVE
Spec Grav, UA: 1.02
Urobilinogen, UA: 0.2

## 2015-07-16 LAB — CBC WITH DIFFERENTIAL/PLATELET
BASOS ABS: 0.1 10*3/uL (ref 0.0–0.1)
Basophils Relative: 0.6 % (ref 0.0–3.0)
EOS PCT: 1.8 % (ref 0.0–5.0)
Eosinophils Absolute: 0.2 10*3/uL (ref 0.0–0.7)
HEMATOCRIT: 41.4 % (ref 36.0–46.0)
HEMOGLOBIN: 14.3 g/dL (ref 12.0–15.0)
LYMPHS ABS: 2.3 10*3/uL (ref 0.7–4.0)
LYMPHS PCT: 24.3 % (ref 12.0–46.0)
MCHC: 34.6 g/dL (ref 30.0–36.0)
MCV: 97.6 fl (ref 78.0–100.0)
MONOS PCT: 6.4 % (ref 3.0–12.0)
Monocytes Absolute: 0.6 10*3/uL (ref 0.1–1.0)
Neutro Abs: 6.3 10*3/uL (ref 1.4–7.7)
Neutrophils Relative %: 66.9 % (ref 43.0–77.0)
Platelets: 333 10*3/uL (ref 150.0–400.0)
RBC: 4.24 Mil/uL (ref 3.87–5.11)
RDW: 12.8 % (ref 11.5–15.5)
WBC: 9.4 10*3/uL (ref 4.0–10.5)

## 2015-07-16 LAB — BASIC METABOLIC PANEL
BUN: 13 mg/dL (ref 6–23)
CALCIUM: 9.6 mg/dL (ref 8.4–10.5)
CO2: 30 mEq/L (ref 19–32)
Chloride: 105 mEq/L (ref 96–112)
Creatinine, Ser: 0.73 mg/dL (ref 0.40–1.20)
GFR: 91.07 mL/min (ref >60.00)
GLUCOSE: 99 mg/dL (ref 70–99)
POTASSIUM: 4.7 meq/L (ref 3.5–5.1)
SODIUM: 140 meq/L (ref 135–145)

## 2015-07-16 MED ORDER — IOPAMIDOL (ISOVUE-300) INJECTION 61%
100.0000 mL | Freq: Once | INTRAVENOUS | Status: AC | PRN
  Administered 2015-07-16: 100 mL via INTRAVENOUS

## 2015-07-16 MED ORDER — CIPROFLOXACIN HCL 500 MG PO TABS
500.0000 mg | ORAL_TABLET | Freq: Two times a day (BID) | ORAL | Status: DC
Start: 2015-07-16 — End: 2015-11-03

## 2015-07-16 NOTE — Addendum Note (Signed)
Addended by: Alysia Penna A on: 07/16/2015 04:02 PM   Modules accepted: Orders

## 2015-07-16 NOTE — Progress Notes (Signed)
   Subjective:    Patient ID: [name] [name], female    DOB: November 12, 1970, 45 y.o.   MRN: [id number]  HPI Here for 4 days of worsening right middle back pain, right flank pain, and RLQ pain. She has also been nauseated but has not vomited. No fever. She has been constipated for a few days, which is unusual for her. She denies any burning on urination or urgency. She just finished a menstrual cycle. She went to a [facility] ER early this morning and had a UA run, which showed large blood but it was negative for nitrites or leukocyte esterace.    Review of Systems  Constitutional: Negative.   Respiratory: Negative.   Cardiovascular: Negative.   Gastrointestinal: Positive for nausea, abdominal pain and constipation. Negative for vomiting, diarrhea, blood in stool, abdominal distention and anal bleeding.  Genitourinary: Positive for flank pain. Negative for dysuria, frequency, hematuria and pelvic pain.       Objective:   Physical Exam  Constitutional:  Ill appearing, in mild pain   Neck: No thyromegaly present.  Cardiovascular: Normal rate, regular rhythm, normal heart sounds and intact distal pulses.   Pulmonary/Chest: Effort normal and breath sounds normal.  Abdominal: Soft. Bowel sounds are normal. She exhibits no distension and no mass. There is no rebound and no guarding.  Mildly tender in the LLQ and RUQ, very tender in the RLQ   Lymphadenopathy:    She has no cervical adenopathy.          Assessment & Plan:  RLQ pain and right flank pain worrisome for possible appendicitis. We will get stat labs including a BMET and CBC. We will arrange for a contrasted CT scan of the abdomen and pelvis sometime today.  Laurey Morale, MD

## 2015-07-16 NOTE — Addendum Note (Signed)
Addended by: Alysia Penna A on: 07/16/2015 03:43 PM   Modules accepted: Orders

## 2015-07-16 NOTE — Progress Notes (Signed)
Pre visit review using our clinic review tool, if applicable. No additional management support is needed unless otherwise documented below in the visit note. 

## 2015-07-18 ENCOUNTER — Encounter: Payer: Self-pay | Admitting: Family Medicine

## 2015-07-18 LAB — URINE CULTURE

## 2015-07-24 ENCOUNTER — Encounter: Payer: Self-pay | Admitting: Family Medicine

## 2015-07-24 MED ORDER — TRAMADOL HCL 50 MG PO TABS: ORAL_TABLET | ORAL | Status: DC

## 2015-07-24 NOTE — Telephone Encounter (Signed)
Ordered the Tramadol, Dr. Sarajane Jews is aware of the allergy to Codeine.

## 2015-07-24 NOTE — Telephone Encounter (Signed)
Call in Tramadol 50 mg to take 1 or 2 every 6 hours prn pain, #60 with no rf

## 2015-07-25 ENCOUNTER — Encounter: Payer: Self-pay | Admitting: Family Medicine

## 2015-07-27 IMAGING — RF DG LUMBAR SPINE 2-3V
1 series · 2 of 2 positions shown · non-contrast
Comparison: None

FLUOROSCOPY TIME:  2 min 8 seconds

C-arm fluoroscopic images were obtained intraoperatively and
submitted for post operative interpretation. Please see the
performing provider's procedural report for the fluoroscopy time
utilized.
EXAM:
DG C-ARM 61-120 MIN; LUMBAR SPINE - 2-3 VIEW

[Series 1: run · 2 of 2 slices shown]
[im 1/2]
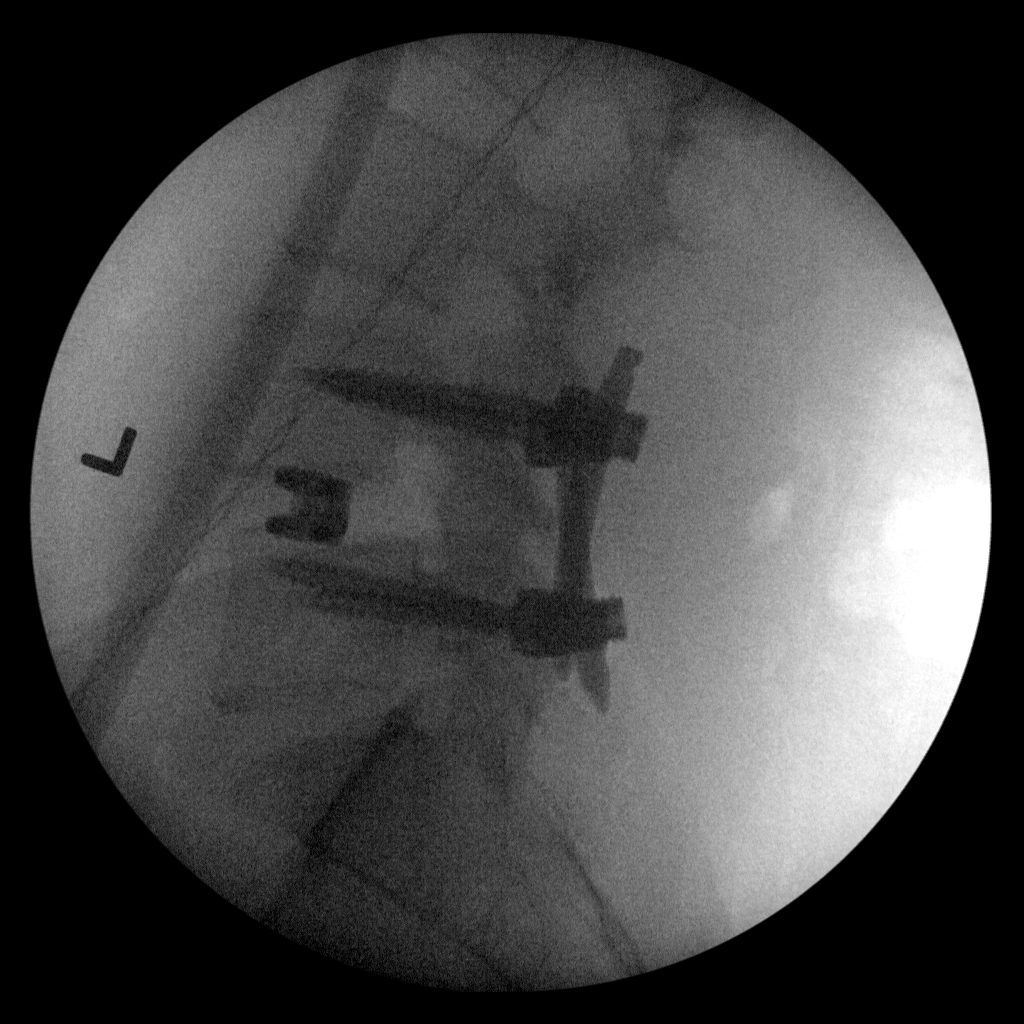
[im 2/2]
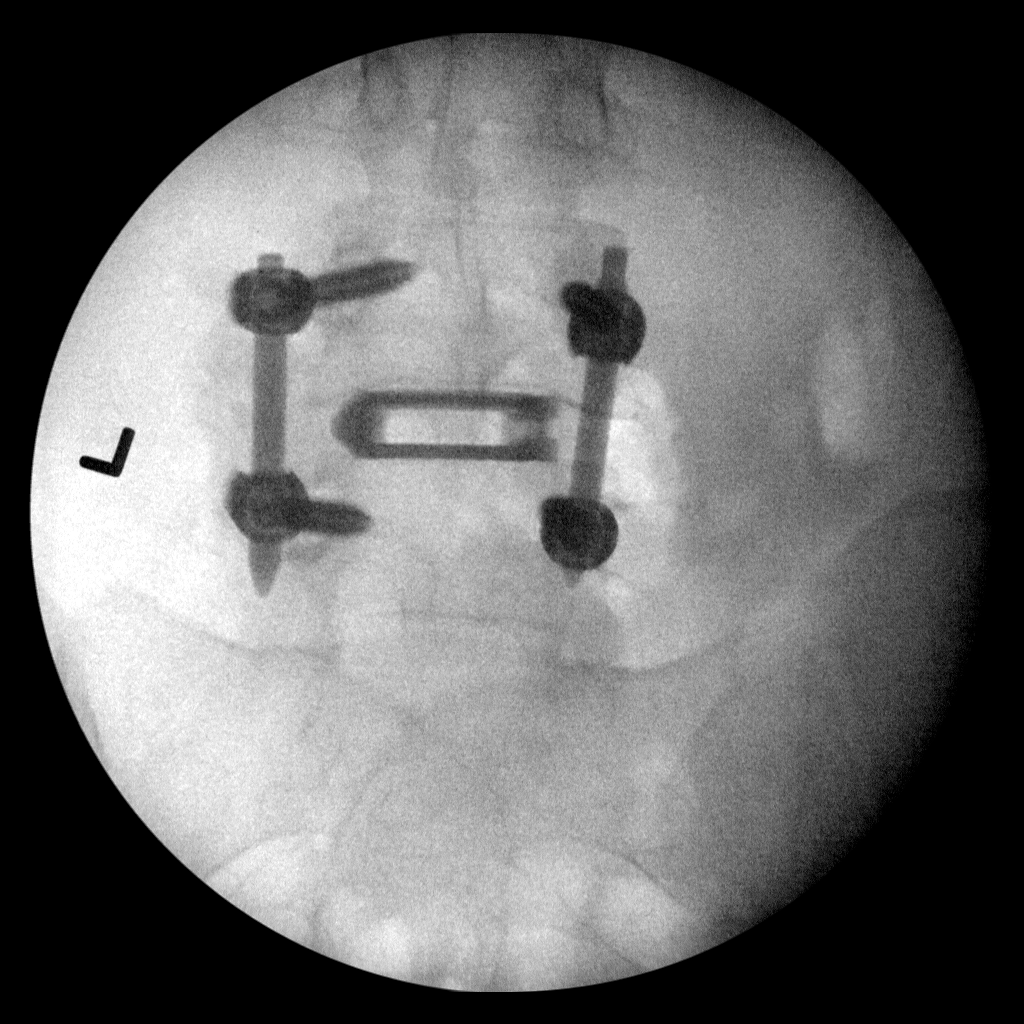

[2 of 2 positions shown; findings below may reference images not displayed]

FINDINGS: AP and lateral images of the lower lumbar spine demonstrate the
patient has undergone interbody and posterior fusion at L4-5.
Hardware appears in good position.
IMPRESSION: Fusion performed at L4-5.

## 2015-08-26 ENCOUNTER — Other Ambulatory Visit: Payer: Self-pay | Admitting: Family Medicine

## 2015-11-03 ENCOUNTER — Other Ambulatory Visit: Payer: Self-pay | Admitting: Family Medicine

## 2015-11-03 ENCOUNTER — Ambulatory Visit (INDEPENDENT_AMBULATORY_CARE_PROVIDER_SITE_OTHER): Payer: Managed Care, Other (non HMO) | Admitting: Family Medicine

## 2015-11-03 ENCOUNTER — Encounter: Payer: Self-pay | Admitting: Family Medicine

## 2015-11-03 VITALS — BP 124/88 | HR 67 | Temp 98.3°F | Ht 67.0 in | Wt 161.0 lb

## 2015-11-03 DIAGNOSIS — R531 Weakness: Secondary | ICD-10-CM

## 2015-11-03 NOTE — Progress Notes (Signed)
Pre visit review using our clinic review tool, if applicable. No additional management support is needed unless otherwise documented below in the visit note. 

## 2015-11-03 NOTE — Progress Notes (Signed)
   Subjective:    Patient ID: [name] [name], female    DOB: 08-08-1970, 45 y.o.   MRN: [id number]  HPI Here for one month of generalized fatigue, weakness, and an overpowering sleepiness that comes and goes. She has never felt like this before. She sleeps well at night but even sleeping for 12 hours at a time does not improve her symptoms. No other symptoms to speak of. No change in diet or activity. She denies any particular stressors in her life.   Review of Systems  Constitutional: Positive for fatigue. Negative for activity change, appetite change, fever and unexpected weight change.  Respiratory: Negative.   Cardiovascular: Negative.   Gastrointestinal: Negative.   Neurological: Positive for weakness. Negative for dizziness, tremors, seizures, syncope, facial asymmetry, speech difficulty, light-headedness, numbness and headaches.  Psychiatric/Behavioral: Negative.        Objective:   Physical Exam  Constitutional: She is oriented to person, place, and time. She appears well-developed and well-nourished. No distress.  Neck: No thyromegaly present.  Cardiovascular: Normal rate, regular rhythm, normal heart sounds and intact distal pulses.   Pulmonary/Chest: Effort normal and breath sounds normal.  Musculoskeletal: Normal range of motion. She exhibits no edema, tenderness or deformity.  Lymphadenopathy:    She has no cervical adenopathy.  Neurological: She is alert and oriented to person, place, and time. She has normal reflexes. No cranial nerve deficit. She exhibits normal muscle tone. Coordination normal.  Psychiatric: She has a normal mood and affect. Her behavior is normal. Thought content normal.          Assessment & Plan:  Episodes of fatigue and sleepiness. We will screen for a number of things associated with this and go from there. Laurey Morale, MD

## 2015-11-04 ENCOUNTER — Encounter: Payer: Self-pay | Admitting: Family Medicine

## 2015-11-04 LAB — HEPATIC FUNCTION PANEL
ALBUMIN: 4.3 g/dL (ref 3.5–5.2)
ALT: 22 U/L (ref 0–35)
AST: 19 U/L (ref 0–37)
Alkaline Phosphatase: 60 U/L (ref 39–117)
BILIRUBIN TOTAL: 0.3 mg/dL (ref 0.2–1.2)
Bilirubin, Direct: 0.1 mg/dL (ref 0.0–0.3)
Total Protein: 6.9 g/dL (ref 6.0–8.3)

## 2015-11-04 LAB — CBC WITH DIFFERENTIAL/PLATELET
Basophils Absolute: 0 10*3/uL (ref 0.0–0.1)
Basophils Relative: 0.1 % (ref 0.0–3.0)
EOS PCT: 2.2 % (ref 0.0–5.0)
Eosinophils Absolute: 0.2 10*3/uL (ref 0.0–0.7)
HCT: 41.9 % (ref 36.0–46.0)
Hemoglobin: 14.4 g/dL (ref 12.0–15.0)
LYMPHS ABS: 3.4 10*3/uL (ref 0.7–4.0)
Lymphocytes Relative: 37.8 % (ref 12.0–46.0)
MCHC: 34.4 g/dL (ref 30.0–36.0)
MCV: 99.3 fl (ref 78.0–100.0)
MONO ABS: 0.6 10*3/uL (ref 0.1–1.0)
MONOS PCT: 6.5 % (ref 3.0–12.0)
NEUTROS PCT: 53.4 % (ref 43.0–77.0)
Neutro Abs: 4.8 10*3/uL (ref 1.4–7.7)
Platelets: 188 10*3/uL (ref 150.0–400.0)
RBC: 4.22 Mil/uL (ref 3.87–5.11)
RDW: 12.9 % (ref 11.5–15.5)
WBC: 9.1 10*3/uL (ref 4.0–10.5)

## 2015-11-04 LAB — BASIC METABOLIC PANEL
BUN: 9 mg/dL (ref 6–23)
CO2: 26 mEq/L (ref 19–32)
CREATININE: 0.63 mg/dL (ref 0.40–1.20)
Calcium: 9.9 mg/dL (ref 8.4–10.5)
Chloride: 105 mEq/L (ref 96–112)
GFR: 107.8 mL/min (ref >60.00)
GLUCOSE: 93 mg/dL (ref 70–99)
POTASSIUM: 4.2 meq/L (ref 3.5–5.1)
Sodium: 140 mEq/L (ref 135–145)

## 2015-11-04 LAB — LYME AB/WESTERN BLOT REFLEX: B burgdorferi Ab IgG+IgM: <0.9 Index (ref <0.90)

## 2015-11-04 LAB — SEDIMENTATION RATE: SED RATE: 9 mm/h (ref 0–20)

## 2015-11-04 LAB — EPSTEIN-BARR VIRUS VCA, IGG: EBV VCA IgG: 697 U/mL — ABNORMAL HIGH

## 2015-11-04 LAB — EPSTEIN-BARR VIRUS VCA, IGM: EBV VCA IgM: <36 U/mL

## 2015-11-04 LAB — VITAMIN D 25 HYDROXY (VIT D DEFICIENCY, FRACTURES): VITD: 83.01 ng/mL (ref 30.00–100.00)

## 2015-11-04 LAB — TSH: TSH: 1.68 u[IU]/mL (ref 0.35–4.50)

## 2015-11-04 LAB — VITAMIN B12: VITAMIN B 12: 231 pg/mL (ref 211–911)

## 2015-11-04 LAB — C-REACTIVE PROTEIN: CRP: 0.1 mg/dL — ABNORMAL LOW (ref 0.5–20.0)

## 2015-11-05 ENCOUNTER — Encounter: Payer: Self-pay | Admitting: Family Medicine

## 2015-11-06 ENCOUNTER — Encounter: Payer: Self-pay | Admitting: Family Medicine

## 2015-11-06 NOTE — Telephone Encounter (Signed)
At this point she is no longer contagious

## 2015-11-06 NOTE — Telephone Encounter (Signed)
We found out she has EBV

## 2015-11-19 ENCOUNTER — Telehealth: Payer: Self-pay | Admitting: Family Medicine

## 2015-11-19 NOTE — Telephone Encounter (Signed)
[facility] - Client  [place]    --------------------------------------------------------------------------------   Patient Name: [name] [name]  Gender: Female  DOB: [birth year]   Age: 45 Y 62 M 26 D  Return Phone Number: [phone] (Primary)  Address:     City/State/Zip:  [place]     Client [facility] - Client  Client Site [facility] - Day  Physician Alysia Penna - MD  Contact Type Call  Who Is Calling Patient / Member / Family / Caregiver  Call Type Triage / Clinical  Relationship To Patient Self  Return Phone Number [phone] (Primary)  Chief Complaint Weakness, Generalized  Reason for Call Symptomatic / Request for Health Information  Initial Comment She was diagnosed with Epstein-Barr recently. She is continuing to feel weaker and weaker each day.  Appointment Disposition EMR Appointment Not Necessary  Info pasted into Epic Yes  PreDisposition Go to ED  Translation No       Nurse Assessment  Nurse: Venetia Maxon, RN, Manuela Schwartz Date/Time (Eastern Time): 11/19/2015 4:20:29 PM  Confirm and document reason for call. If symptomatic, describe symptoms. You must click the next button to save text entered. ---She was diagnosed with Epstein-Barr recently. She is continuing to feel weaker and weaker each day. She was dxd with mono. She has been reading up about this. The visit was 3 wks she thinks she has a low grade fever. She was feeling better ., but last week better than this week she had to go to bed at 6:30p still able to work., She is "dead tired". She felt like her legs could collapse. She has a headache for 2 days. weak and shaky. not like she is going to collapse. she feels dizzy. Last urine was an hr ago    Has the patient traveled out of the country within the last 30 days? ---No    Does the patient have any new or worsening symptoms? ---Yes    Will a  triage be completed? ---Yes    Related visit to physician within the last 2 weeks? ---No    Does the PT have any chronic conditions? (i.e. diabetes, asthma, etc.) ---Yes    List chronic conditions. 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 and muscle low Vit D . takes supplements. LMP began July 30    Is the patient pregnant or possibly pregnant? (Ask all females between the ages of 50-55) ---No    Is this a behavioral health or substance abuse call? ---No           Guidelines          Guideline Title Affirmed Question Affirmed Notes Nurse Date/Time (Eastern Time)  Abdominal Pain - Upper [1] Pain lasts > 10 minutes AND [2] age > 74 AND [3] associated chest, arm, neck, upper back or jaw pain    Venetia Maxon, RN, Manuela Schwartz 11/19/2015 4:28:53 PM    Disp. Time Eilene Ghazi Time) Disposition Final User    11/19/2015 3:58:49 PM Attempt made - message left   Venetia Maxon, RN, Manuela Schwartz      11/19/2015 4:31:47 PM Go to ED Now Yes Venetia Maxon, RN, Edwena Bunde Understands: Yes  Disagree/Comply: Comply       Care Advice Given Per Guideline        GO TO ED NOW: You need to be seen in the Emergency Department. Go to  the ER at ___________ [place] now. Drive carefully. CARE ADVICE given per Abdominal Pain, Upper (Adult) guideline. * Please bring a list of your current medicines when you go to the Emergency Department (ER).        --------------------------------------------------------------------------------         Comments  User: Willey Blade, RN Date/Time Eilene Ghazi Time): 11/19/2015 4:32:34 PM  Caller states she is having pain./cramping in left abdomen at waist height . reaches to the left flank.    Referrals  GO TO FACILITY OTHER - SPECIFY

## 2015-11-20 NOTE — Telephone Encounter (Signed)
Patient did not go to ED. Please follow up and arrange appointment if warrants.

## 2015-11-20 NOTE — Telephone Encounter (Signed)
Left message for pt to call back  °

## 2015-11-21 NOTE — Telephone Encounter (Signed)
Patient did go to ER in [place] and they rxd rest and wrote her out of work through 11-21-15. Is cleared to go back to work on Monday. Patients boss is giving her a hard time about missing work. He thinks that once she comes back she should be able to go full speed with no problems. 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 Is there any kind of note we can write for her boss that would explain her condition and that she needs lots of time to recover?    We are to call her on Monday and let her know if this is possible and if so she will give [place] the fax number at her job so we can fax a note over. She's not wanting more time off work, just some understanding and leniency from boss.

## 2015-11-23 ENCOUNTER — Other Ambulatory Visit: Payer: Self-pay | Admitting: Family Medicine

## 2015-11-24 NOTE — Telephone Encounter (Signed)
Rx refill sent to pharmacy. 

## 2015-11-24 NOTE — Telephone Encounter (Signed)
Have her make an OV so we can decide the best course to take and discuss what to write in this note

## 2015-11-24 NOTE — Telephone Encounter (Signed)
Can you call pt to schedule a office visit? 

## 2015-11-25 NOTE — Telephone Encounter (Signed)
Left message for pt to call back  °

## 2015-11-25 NOTE — Telephone Encounter (Signed)
Pt has been sch for 11-26-15

## 2015-11-26 ENCOUNTER — Encounter: Payer: Self-pay | Admitting: Family Medicine

## 2015-11-26 ENCOUNTER — Ambulatory Visit (INDEPENDENT_AMBULATORY_CARE_PROVIDER_SITE_OTHER): Payer: Managed Care, Other (non HMO) | Admitting: Family Medicine

## 2015-11-26 VITALS — BP 155/99 | HR 66 | Temp 98.5°F | Ht 67.0 in | Wt 165.0 lb

## 2015-11-26 DIAGNOSIS — B2700 Gammaherpesviral mononucleosis without complication: Secondary | ICD-10-CM | POA: Insufficient documentation

## 2015-11-26 MED ORDER — LORAZEPAM 0.5 MG PO TABS: 0.5000 mg | ORAL_TABLET | Freq: Three times a day (TID) | ORAL | 5 refills | Status: DC | PRN

## 2015-11-26 NOTE — Progress Notes (Signed)
   Subjective:    Patient ID: [name] [name], female    DOB: [birth year], 45 y.o.   MRN: [id number]  HPI Here to discuss how to proceed as as far as returning to work after a bout of mononucleosis. She saw [place] on 11-03-15 for extreme fatigue and some upper abdominal pains. Her lab work revealed exposure to EBV virus and she was diagnosed with acute mononucleosis. Of course there is no specific treatment so we advised her to rest at home and to stay out of work. She missed work off and on for a few weeks, but had several relapses where she needed to stay home a few days. She went to a [facility] ER on 11-19-15 and was evaluated with labs which showed normal liver transaminases and CBC and with a CT scan which was unremarkable. She is now back to work but is concerned she may have some relapses and asks ow we can protect her job. She feels fine today.    Review of Systems  Constitutional: Positive for fatigue.  HENT: Negative.   Eyes: Negative.   Respiratory: Negative.   Cardiovascular: Negative.   Gastrointestinal: Negative.   Genitourinary: Negative.   Neurological: Negative.        Objective:   Physical Exam  Constitutional: She is oriented to person, place, and time. She appears well-developed and well-nourished. No distress.  Eyes: No scleral icterus.  Cardiovascular: Normal rate, regular rhythm, normal heart sounds and intact distal pulses.   Pulmonary/Chest: Effort normal and breath sounds normal.  Abdominal: Soft. Bowel sounds are normal. She exhibits no distension. There is no tenderness. There is no rebound and no guarding.  Neurological: She is alert and oriented to person, place, and time.          Assessment & Plan:  She is recovering from mononucleosis and today she seems fine. I suggested she talk to her HR department at work and get some FMLA forms. We plan to provide her with intermittent leave for a period of 90 days, in which she wil be covered for up to 4 occurrences a  month and for up to 3 days per occurrence.  Laurey Morale, MD

## 2015-11-26 NOTE — Progress Notes (Signed)
Pre visit review using our clinic review tool, if applicable. No additional management support is needed unless otherwise documented below in the visit note. 

## 2015-11-29 IMAGING — US US PELVIS COMPLETE
1 series · 13 of 25 positions shown · non-contrast
Comparison: None

CLINICAL DATA: RIGHT lower quadrant pain for 2 days since menses
began, abdominal pain, past history of ectopic pregnancy x 2, prior
RIGHT salpingectomy

EXAM:
TRANSABDOMINAL AND TRANSVAGINAL ULTRASOUND OF PELVIS
TECHNIQUE: Both transabdominal and transvaginal ultrasound examinations of the
pelvis were performed. Transabdominal technique was performed for
global imaging of the pelvis including uterus, ovaries, adnexal
regions, and pelvic cul-de-sac. It was necessary to proceed with
endovaginal exam following the transabdominal exam to characterize a
LEFT adnexal lesion.

[Series 1: us pelvis complete · 0.24mm/px · 13 of 56 slices shown]
[im 1/56]
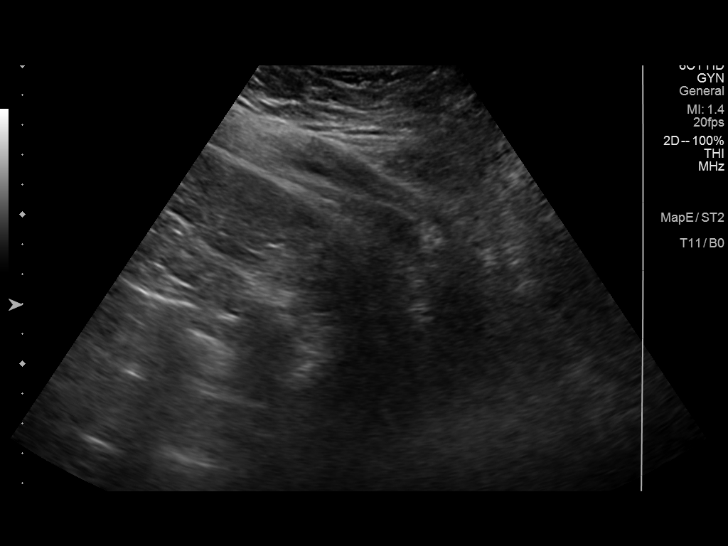
[im 5/56]
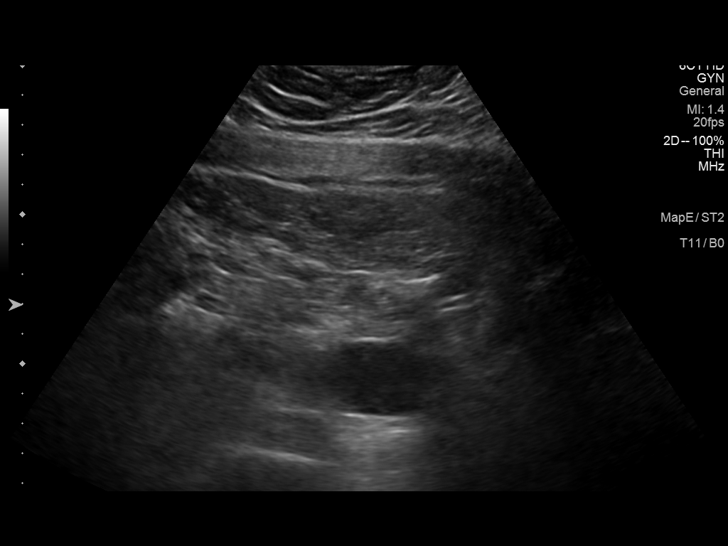
[im 10/56]
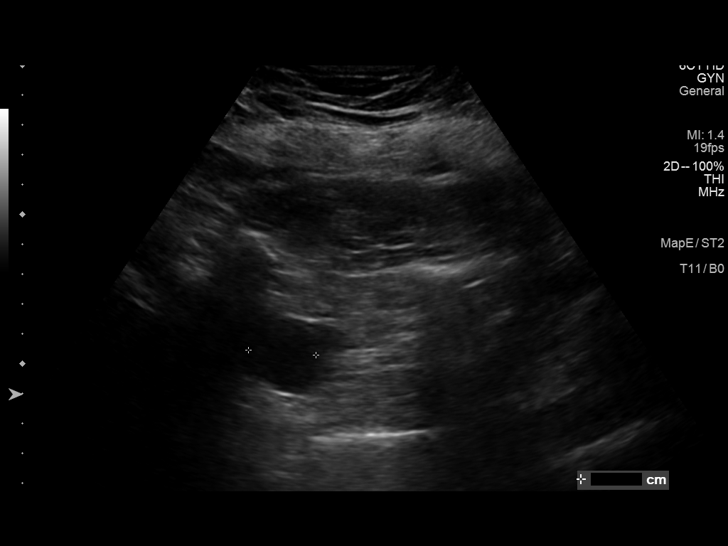
[im 14/56]
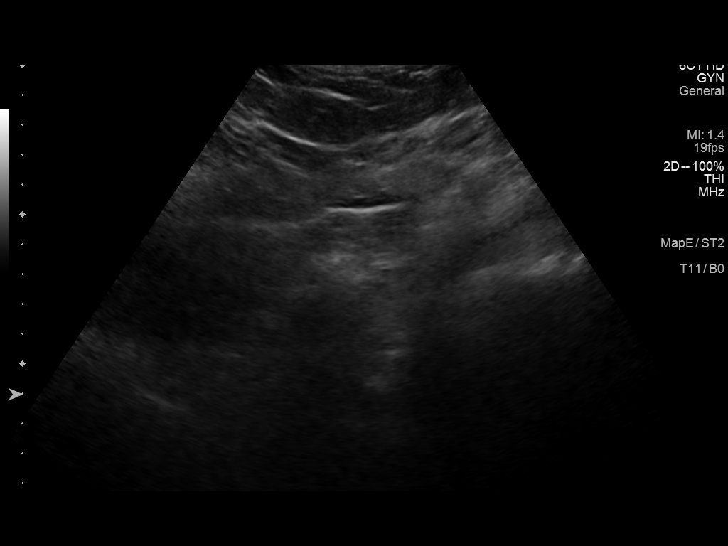
[im 19/56]
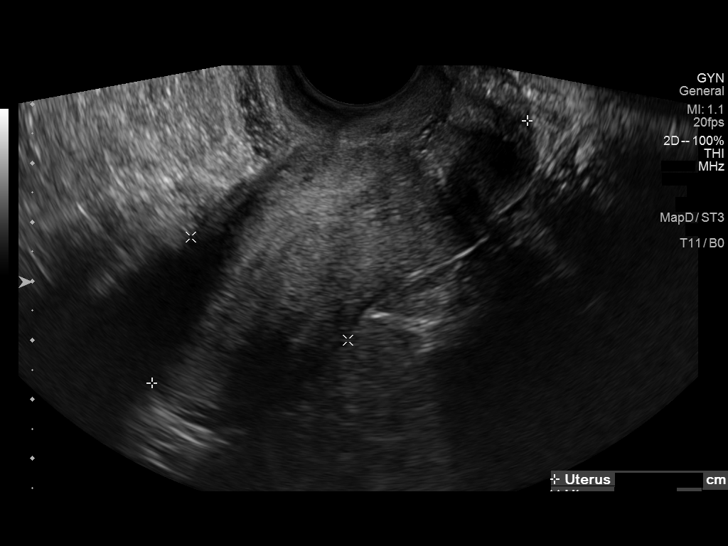
[im 23/56]
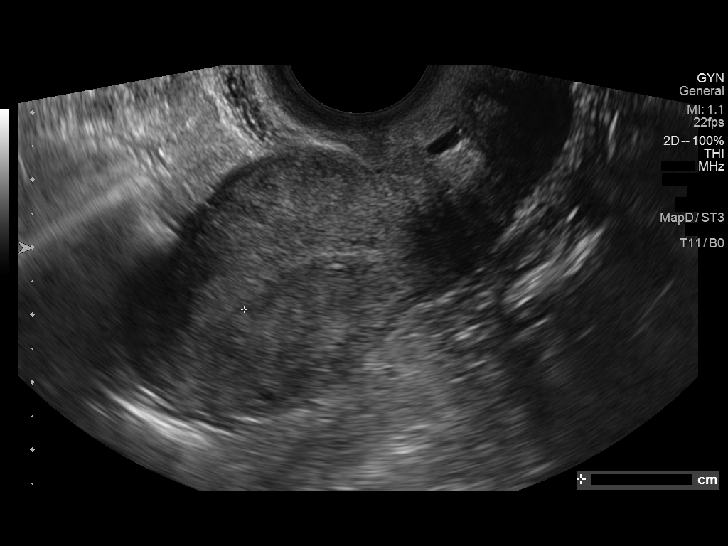
[im 28/56]
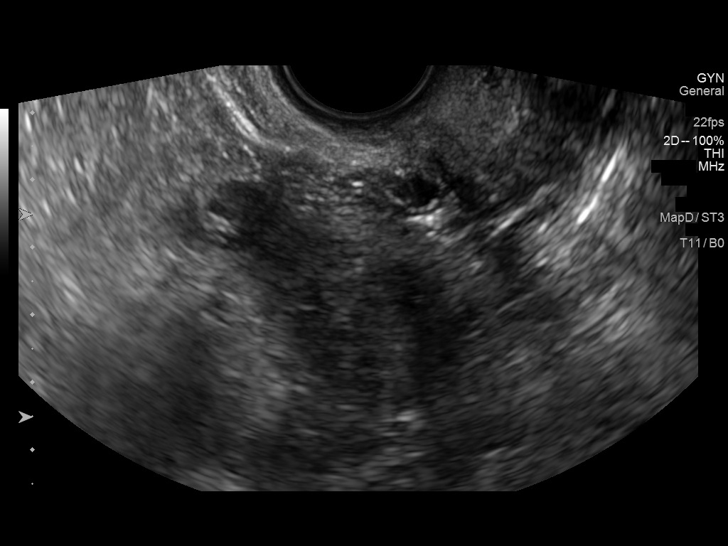
[im 33/56]
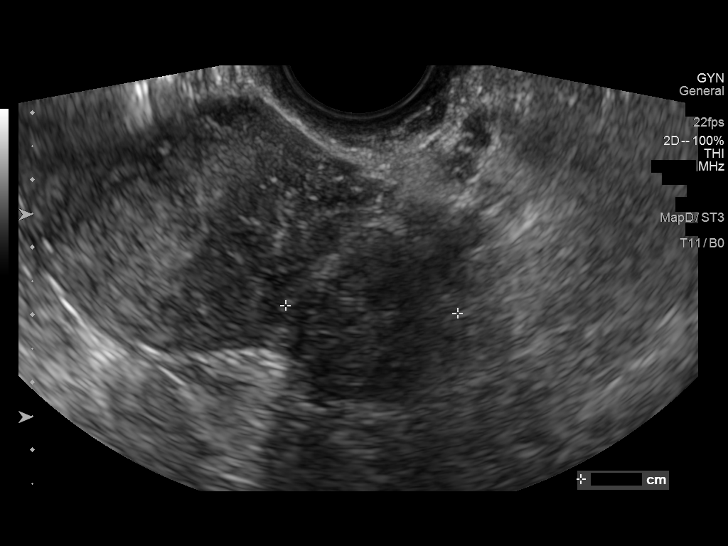
[im 37/56]
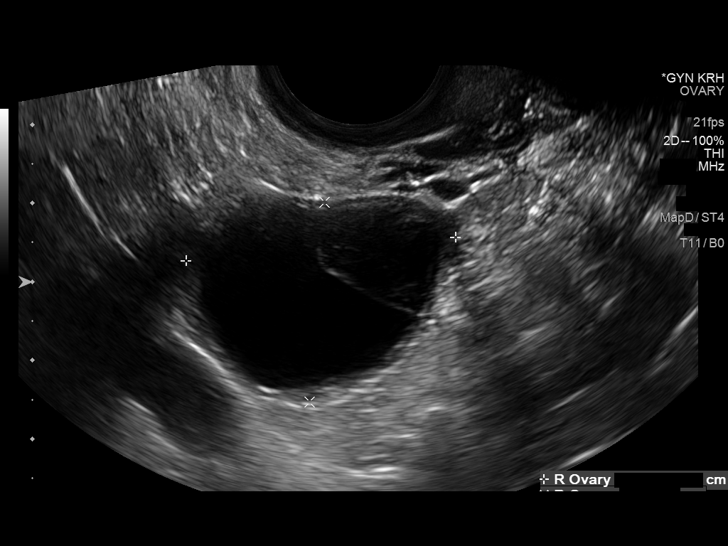
[im 42/56]
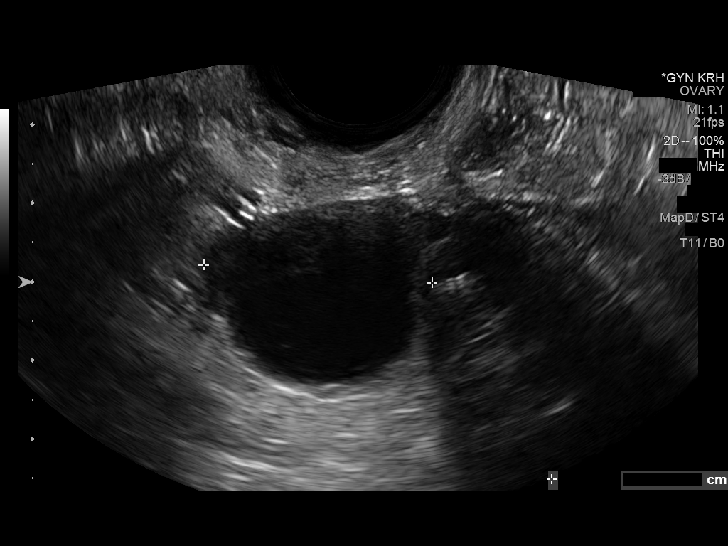
[im 46/56]
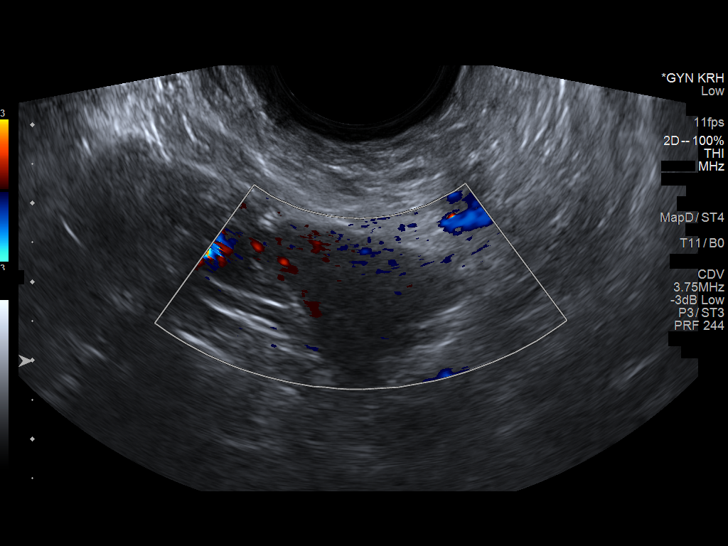
[im 51/56]
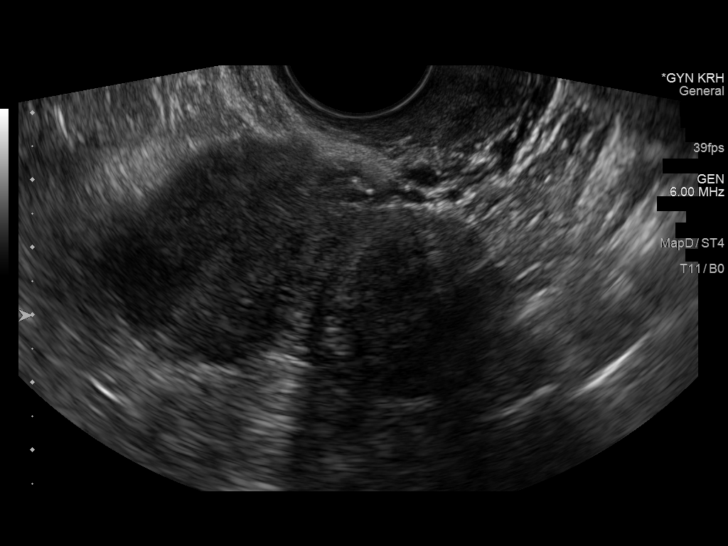
[im 56/56]
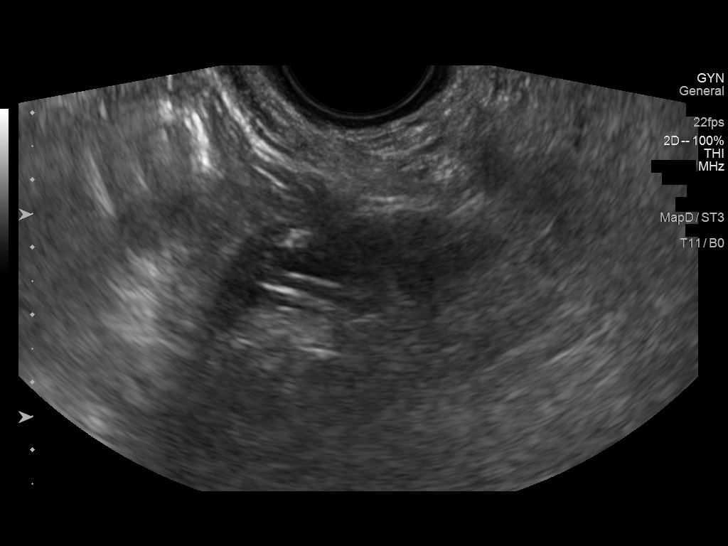

[13 of 25 positions shown; findings below may reference images not displayed]

FINDINGS: Uterus

Measurements: 7.8 x 3.2 x 3.8 cm. Normal myometrial echogenicity.
Mass identified adjacent to the LEFT lateral aspect of the uterus,
demonstrates contiguity with the LEFT lateral uterine wall likely an
exophytic leiomyoma 2.8 x 2.6 x 2.7 cm. No additional uterine
masses.

Endometrium

Thickness: 7 mm thick, normal. No endometrial fluid or focal
abnormalities.

Right ovary

Measurements: 3.4 x 2.6 x 2.9 cm. Complex cystic lesion within RIGHT
ovary 2.9 x 2.4 x 2.4 cm. Cystic mass contains thin septations
without mural nodularity. Blood flow present within RIGHT ovary on
color Doppler imaging.

Left ovary

Measurements: 1.9 x 1.5 x 1.9 cm. Normal morphology without mass.
Blood flow appears to be present within LEFT ovary on color Doppler
imaging, less well visualized than on RIGHT.

Other findings

No free pelvic fluid or additional adnexal masses.
IMPRESSION: Probable exophytic leiomyoma at LEFT lateral aspect of uterus 2.8 cm
greatest size.

Complicated septated cystic lesion within RIGHT ovary 2.9 x 2.2 x
2.4 cm.

No other intrapelvic abnormalities.

## 2015-12-02 ENCOUNTER — Encounter: Payer: Self-pay | Admitting: Family Medicine

## 2015-12-03 ENCOUNTER — Encounter: Payer: Self-pay | Admitting: Family Medicine

## 2015-12-24 ENCOUNTER — Encounter: Payer: Self-pay | Admitting: Family Medicine

## 2015-12-26 MED ORDER — CYCLOBENZAPRINE HCL 5 MG PO TABS: 5.0000 mg | ORAL_TABLET | Freq: Three times a day (TID) | ORAL | 1 refills | Status: DC | PRN

## 2015-12-30 ENCOUNTER — Encounter: Payer: Self-pay | Admitting: Family Medicine

## 2016-01-05 NOTE — Telephone Encounter (Signed)
Yes she can get a flu shot any time now

## 2016-01-29 ENCOUNTER — Encounter: Payer: Self-pay | Admitting: Family Medicine

## 2016-04-11 ENCOUNTER — Other Ambulatory Visit: Payer: Self-pay | Admitting: Family Medicine

## 2016-07-14 ENCOUNTER — Other Ambulatory Visit: Payer: Self-pay | Admitting: Family Medicine

## 2016-07-14 NOTE — Telephone Encounter (Signed)
Call in #[phone] with 5 rf 

## 2016-07-21 ENCOUNTER — Telehealth: Payer: Self-pay | Admitting: Family Medicine

## 2016-07-21 MED ORDER — CYCLOBENZAPRINE HCL 5 MG PO TABS: ORAL_TABLET | ORAL | 2 refills | Status: DC

## 2016-07-21 NOTE — Telephone Encounter (Signed)
Call in #90 with 2 rf 

## 2016-07-21 NOTE — Telephone Encounter (Signed)
Last filled 04/12/16 #90 Last seen 11/26/15 No future appointment scheduled.

## 2016-07-21 NOTE — Telephone Encounter (Signed)
Pt request refill  cyclobenzaprine (FLEXERIL) 5 MG tablet   Pharmacy   [id number] - [place], [place]

## 2016-07-21 NOTE — Telephone Encounter (Signed)
I sent script e-scribe to below pharmacy.  

## 2016-11-22 ENCOUNTER — Encounter: Payer: Self-pay | Admitting: Family Medicine

## 2016-11-22 NOTE — Telephone Encounter (Signed)
Call in Flexeril 10 mg tid prn spasms, #60 with no rf. Also call in Metoprolol succinate 25 mg daily, #30 with 2 rf. She will need an OV sometime soon

## 2016-11-23 ENCOUNTER — Other Ambulatory Visit: Payer: Self-pay | Admitting: Family Medicine

## 2016-11-23 MED ORDER — METOPROLOL SUCCINATE ER 25 MG PO TB24: 25.0000 mg | ORAL_TABLET | Freq: Every day | ORAL | 0 refills | Status: DC

## 2016-12-16 ENCOUNTER — Encounter: Payer: Self-pay | Admitting: Family Medicine

## 2017-01-24 ENCOUNTER — Encounter: Payer: Self-pay | Admitting: Family Medicine

## 2017-01-24 ENCOUNTER — Ambulatory Visit (INDEPENDENT_AMBULATORY_CARE_PROVIDER_SITE_OTHER): Payer: Self-pay | Admitting: Family Medicine

## 2017-01-24 VITALS — BP 112/86 | HR 75 | Temp 98.6°F | Ht 67.0 in | Wt 163.0 lb

## 2017-01-24 DIAGNOSIS — J209 Acute bronchitis, unspecified: Secondary | ICD-10-CM

## 2017-01-24 MED ORDER — LORAZEPAM 0.5 MG PO TABS: 0.5000 mg | ORAL_TABLET | Freq: Three times a day (TID) | ORAL | 5 refills | Status: DC | PRN

## 2017-01-24 MED ORDER — CYCLOBENZAPRINE HCL 5 MG PO TABS: ORAL_TABLET | ORAL | 2 refills | Status: DC

## 2017-01-24 MED ORDER — ALBUTEROL SULFATE HFA 108 (90 BASE) MCG/ACT IN AERS: INHALATION_SPRAY | RESPIRATORY_TRACT | 5 refills | Status: DC

## 2017-01-24 MED ORDER — CLARITHROMYCIN 500 MG PO TABS: 500.0000 mg | ORAL_TABLET | Freq: Two times a day (BID) | ORAL | 0 refills | Status: DC

## 2017-01-24 NOTE — Patient Instructions (Signed)
WE NOW OFFER   [name] [name]'s FAST TRACK!!!  SAME DAY Appointments for ACUTE CARE  Such as: Sprains, Injuries, cuts, abrasions, rashes, muscle pain, joint pain, back pain Colds, flu, sore throats, headache, allergies, cough, fever  Ear pain, sinus and eye infections Abdominal pain, nausea, vomiting, diarrhea, upset stomach Animal/insect bites  3 Easy Ways to Schedule: Walk-In Scheduling Call in scheduling Mychart Sign-up: [email or web address]         

## 2017-01-24 NOTE — Progress Notes (Signed)
   Subjective:    Patient ID: [name] [name], female    DOB: [birth year], 46 y.o.   MRN: [id number]  HPI Here for 3 weeks of chest tightness and a dry cough. This started with sinus congestion and PND, then it moved to her chest. Using Robitussin and Sudafed.    Review of Systems  Constitutional: Negative.   HENT: Positive for congestion and postnasal drip. Negative for sinus pain, sinus pressure and sore throat.   Eyes: Negative.   Respiratory: Positive for cough and chest tightness.        Objective:   Physical Exam  Constitutional: She appears well-developed and well-nourished.  HENT:  Right Ear: External ear normal.  Left Ear: External ear normal.  Nose: Nose normal.  Mouth/Throat: Oropharynx is clear and moist.  Eyes: Conjunctivae are normal.  Neck: No thyromegaly present.  Pulmonary/Chest: Effort normal. No respiratory distress. She has no rales.  Scattered wheezes   Lymphadenopathy:    She has no cervical adenopathy.          Assessment & Plan:  Bronchitis, treat with Biaxin. Written out of work 01-20-17 to 01-26-17. Alysia Penna, MD

## 2017-02-05 ENCOUNTER — Other Ambulatory Visit: Payer: Self-pay | Admitting: Family Medicine

## 2017-02-11 ENCOUNTER — Telehealth: Payer: Self-pay | Admitting: Family Medicine

## 2017-02-11 MED ORDER — AMOXICILLIN-POT CLAVULANATE 875-125 MG PO TABS: 1.0000 | ORAL_TABLET | Freq: Two times a day (BID) | ORAL | 0 refills | Status: DC

## 2017-02-11 NOTE — Telephone Encounter (Signed)
Call in Augmentin [phone] bid for 10 days  

## 2017-02-11 NOTE — Telephone Encounter (Signed)
Copied from [place] [phone]. Topic: General - Other >> Feb 11, 2017 11:41 AM Carolyn Stare wrote:   Pt said he does not have dental insurance and has a abcessed tooth and is asking if Dr Sarajane Jews will call her a antibiotic    pharmacy [address]    can leave a message pt is at work   Pt phone [phone]

## 2017-02-11 NOTE — Telephone Encounter (Signed)
Rx was e-scribed to pt's pharmacy. LM on pt's machine to advise Rx was sent to pt's pharmacy.

## 2017-05-15 ENCOUNTER — Other Ambulatory Visit: Payer: Self-pay | Admitting: Family Medicine

## 2017-08-29 ENCOUNTER — Other Ambulatory Visit: Payer: Self-pay | Admitting: Family Medicine

## 2018-06-22 ENCOUNTER — Telehealth: Payer: Self-pay | Admitting: Family Medicine

## 2018-06-22 NOTE — Telephone Encounter (Signed)
webex appointment has been scheduled. Will send to Dr. Sarajane Jews as Juluis Rainier

## 2018-06-22 NOTE — Telephone Encounter (Signed)
Rec'd CRM on pt and she wanted a called.  Called pt back and she state that she would like to have a full exam she is aware that we are not able to do a CPE at this present time but we are certainly able to get a OV via Webex and she said that was great and she is aware that Marliss Czar will be giving her a call in advance to get her set up for the Webex visit on 3/27 at 9:15.    Pt would like to have the following Rx's refilled: Albuterol inhaler,cyclobenzaprine (FLEXERIL) 5 MG, LORazepam 0.5 MG and metoprolol succinate 25 MG.  Pharm:  [facility] in [place].   Pt stated that she had gone to the [facility] ER 3 weeks ago and her BP was 150/96 and the Dentist lst week and her BP reading was 165/91? Or 168/91 or 98? She wasn't sure what it was she think it was one of those readings.   Can you please re-verify the insurance.

## 2018-06-23 ENCOUNTER — Other Ambulatory Visit: Payer: Self-pay

## 2018-06-23 ENCOUNTER — Ambulatory Visit (INDEPENDENT_AMBULATORY_CARE_PROVIDER_SITE_OTHER): Payer: Self-pay | Admitting: Family Medicine

## 2018-06-23 ENCOUNTER — Encounter: Payer: Self-pay | Admitting: Family Medicine

## 2018-06-23 DIAGNOSIS — I10 Essential (primary) hypertension: Secondary | ICD-10-CM

## 2018-06-23 DIAGNOSIS — M545 Low back pain: Secondary | ICD-10-CM

## 2018-06-23 DIAGNOSIS — J45909 Unspecified asthma, uncomplicated: Secondary | ICD-10-CM

## 2018-06-23 DIAGNOSIS — G8929 Other chronic pain: Secondary | ICD-10-CM

## 2018-06-23 DIAGNOSIS — F411 Generalized anxiety disorder: Secondary | ICD-10-CM

## 2018-06-23 MED ORDER — LORAZEPAM 0.5 MG PO TABS: 0.5000 mg | ORAL_TABLET | Freq: Three times a day (TID) | ORAL | 5 refills | Status: DC | PRN

## 2018-06-23 MED ORDER — CYCLOBENZAPRINE HCL 5 MG PO TABS: ORAL_TABLET | ORAL | 5 refills | Status: DC

## 2018-06-23 MED ORDER — HYDROCHLOROTHIAZIDE 25 MG PO TABS: 25.0000 mg | ORAL_TABLET | Freq: Every day | ORAL | 3 refills | Status: DC

## 2018-06-23 MED ORDER — METOPROLOL SUCCINATE ER 25 MG PO TB24: ORAL_TABLET | ORAL | 3 refills | Status: DC

## 2018-06-23 MED ORDER — ALBUTEROL SULFATE HFA 108 (90 BASE) MCG/ACT IN AERS: INHALATION_SPRAY | RESPIRATORY_TRACT | 5 refills | Status: DC

## 2018-06-23 NOTE — Progress Notes (Signed)
Subjective:    Patient ID: [name] [name], female    DOB: 1971-02-08, 48 y.o.   MRN: [id number]  HPI Virtual Visit via Video Note  I connected with the patient on 06/23/18 at  9:15 AM EDT by a video enabled telemedicine application and verified that I am speaking with the correct person using two identifiers.  Location patient: home Location provider:work or home office Persons participating in the virtual visit: patient, provider  I discussed the limitations of evaluation and management by telemedicine and the availability of in person appointments. The patient expressed understanding and agreed to proceed.   HPI: She has a few issues to discuss. First she is out of Lorazepam. Her anxiety has been well controlled lately. She needs her inhaler to be refilled, and her asthma has been stable. She is worried about her BP however. She had taken medication for HTN a few years ago but she was able to get off them. However at a recent ER visit and a dentist visit her BP has been in the 150s or 160s over 90s. She has had some mild headaches but no SOB or chest pain.    ROS: See pertinent positives and negatives per HPI.  Past Medical History:  Diagnosis Date  . ALLERGIC RHINITIS 06/26/2007  . ANXIETY 08/22/2007  . ASTHMA 06/26/2007   last attack 1 mo ago  . ASTHMA, WITH ACUTE EXACERBATION 07/26/2007  . DEPRESSION 08/22/2007  . DM (dermatomyositis)    sees Dr. Jerene Pitch (Rheum at [facility]) and Dr. Saundra Shelling (Derm at [facility])  . Fibromyalgia   . GERD (gastroesophageal reflux disease)   . HYPERGLYCEMIA 08/22/2007  . Hypertension   . MYALGIA 04/15/2010  . NICOTINE ADDICTION 06/26/2007  . NONSPECIFIC ABNORM RESULTS THYROID FUNCT STUDY 08/22/2007  . Sinusitis   . Spontaneous pneumothorax     Past Surgical History:  Procedure Laterality Date  . CERVICAL CONIZATION W/BX    . fallopian tubectomy Right 2005  . KNEE SURGERY    . LUMBAR FUSION  08/23/2013   L4 L5   DR BROOKS  . LUNG SURGERY       History reviewed. No pertinent family history.  SOCIAL HX:    Current Outpatient Medications:  .  Calcium Carb-Cholecalciferol (CALCIUM 600 + D PO), Take by mouth. ( vitamin d 800 mg ), Disp: , Rfl:  .  cyclobenzaprine (FLEXERIL) 5 MG tablet, TAKE 1 TABLET(5 MG) BY MOUTH THREE TIMES DAILY AS NEEDED FOR MUSCLE SPASMS, Disp: 90 tablet, Rfl: 2 .  DiphenhydrAMINE HCl (BENADRYL PO), Take by mouth at bedtime., Disp: , Rfl:  .  LORazepam (ATIVAN) 0.5 MG tablet, Take 1 tablet (0.5 mg total) by mouth 3 (three) times daily as needed. for anxiety, Disp: 90 tablet, Rfl: 5 .  VITAMIN D, CHOLECALCIFEROL, PO, Take 2,000 mg by mouth 2 (two) times daily. , Disp: , Rfl:   EXAM:  VITALS per patient if applicable:  GENERAL: alert, oriented, appears well and in no acute distress  HEENT: atraumatic, conjunttiva clear, no obvious abnormalities on inspection of external nose and ears  NECK: normal movements of the head and neck  LUNGS: on inspection no signs of respiratory distress, breathing rate appears normal, no obvious gross SOB, gasping or wheezing  CV: no obvious cyanosis  MS: moves all visible extremities without noticeable abnormality  PSYCH/NEURO: pleasant and cooperative, no obvious depression or anxiety, speech and thought processing grossly intact  ASSESSMENT AND PLAN: Her asthma is stable, refill the Ventolin HFA. Her anxiety is  stable, refill the Lorazepam. Her HTN has returned so she will get back on Metoprolol succinate 25 mg daily and HCTZ 25 mg daily. Recheck in 3 weeks.  Discussed the following assessment and plan:  No diagnosis found.     I discussed the assessment and treatment plan with the patient. The patient was provided an opportunity to ask questions and all were answered. The patient agreed with the plan and demonstrated an understanding of the instructions.   The patient was advised to call back or seek an in-person evaluation if the symptoms worsen or if the  condition fails to improve as anticipated.  I provided 26 minutes of non-face-to-face time during this encounter.    Review of Systems     Objective:   Physical Exam        Assessment & Plan:

## 2018-08-30 ENCOUNTER — Encounter: Payer: Self-pay | Admitting: Family Medicine

## 2018-08-30 ENCOUNTER — Other Ambulatory Visit: Payer: Self-pay

## 2018-08-30 ENCOUNTER — Ambulatory Visit (INDEPENDENT_AMBULATORY_CARE_PROVIDER_SITE_OTHER): Payer: Self-pay | Admitting: Family Medicine

## 2018-08-30 VITALS — Temp 97.2°F

## 2018-08-30 DIAGNOSIS — J019 Acute sinusitis, unspecified: Secondary | ICD-10-CM

## 2018-08-30 MED ORDER — AZITHROMYCIN 250 MG PO TABS: ORAL_TABLET | ORAL | 0 refills | Status: DC

## 2018-08-30 NOTE — Progress Notes (Signed)
Subjective:    Patient ID: [name] [name], female    DOB: Mar 28, 1971, 48 y.o.   MRN: [id number]  HPI Here for 2 days of frontal HA, PND, ST (especially on the left side), and a dry cough. No fever. She is taking Tylenol and Loratidine.  Virtual Visit via Video Note  I connected with the patient on 08/30/18 at 10:00 AM EDT by a video enabled telemedicine application and verified that I am speaking with the correct person using two identifiers.  Location patient: home Location provider:work or home office Persons participating in the virtual visit: patient, provider  I discussed the limitations of evaluation and management by telemedicine and the availability of in person appointments. The patient expressed understanding and agreed to proceed.   HPI:    ROS: See pertinent positives and negatives per HPI.  Past Medical History:  Diagnosis Date  . ALLERGIC RHINITIS 06/26/2007  . ANXIETY 08/22/2007  . ASTHMA 06/26/2007   last attack 1 mo ago  . ASTHMA, WITH ACUTE EXACERBATION 07/26/2007  . DEPRESSION 08/22/2007  . DM (dermatomyositis)    sees Dr. Jerene Pitch (Rheum at [facility]) and Dr. Saundra Shelling (Derm at [facility])  . Fibromyalgia   . GERD (gastroesophageal reflux disease)   . HYPERGLYCEMIA 08/22/2007  . Hypertension   . MYALGIA 04/15/2010  . NICOTINE ADDICTION 06/26/2007  . NONSPECIFIC ABNORM RESULTS THYROID FUNCT STUDY 08/22/2007  . Sinusitis   . Spontaneous pneumothorax     Past Surgical History:  Procedure Laterality Date  . CERVICAL CONIZATION W/BX    . fallopian tubectomy Right 2005  . KNEE SURGERY    . LUMBAR FUSION  08/23/2013   L4 L5   DR BROOKS  . LUNG SURGERY      History reviewed. No pertinent family history.   Current Outpatient Medications:  .  albuterol (VENTOLIN HFA) 108 (90 Base) MCG/ACT inhaler, INHALE 2 PUFFS BY MOUTH EVERY 4 HOURS AS NEEDED FOR WHEEZING OR SHORTNESS OF BREATH, Disp: 18 g, Rfl: 5 .  Calcium Carb-Cholecalciferol (CALCIUM 600 + D PO),  Take by mouth. ( vitamin d 800 mg ), Disp: , Rfl:  .  cyclobenzaprine (FLEXERIL) 5 MG tablet, TAKE 1 TABLET(5 MG) BY MOUTH THREE TIMES DAILY AS NEEDED FOR MUSCLE SPASMS, Disp: 90 tablet, Rfl: 5 .  DiphenhydrAMINE HCl (BENADRYL PO), Take by mouth at bedtime., Disp: , Rfl:  .  hydrochlorothiazide (HYDRODIURIL) 25 MG tablet, Take 1 tablet (25 mg total) by mouth daily., Disp: 90 tablet, Rfl: 3 .  LORazepam (ATIVAN) 0.5 MG tablet, Take 1 tablet (0.5 mg total) by mouth 3 (three) times daily as needed for anxiety. for anxiety, Disp: 90 tablet, Rfl: 5 .  metoprolol succinate (TOPROL-XL) 25 MG 24 hr tablet, TAKE 1 TABLET(25 MG) BY MOUTH DAILY, Disp: 90 tablet, Rfl: 3 .  VITAMIN D, CHOLECALCIFEROL, PO, Take 2,000 mg by mouth 2 (two) times daily. , Disp: , Rfl:   EXAM:  VITALS per patient if applicable:  GENERAL: alert, oriented, appears well and in no acute distress  HEENT: atraumatic, conjunttiva clear, no obvious abnormalities on inspection of external nose and ears  NECK: normal movements of the head and neck  LUNGS: on inspection no signs of respiratory distress, breathing rate appears normal, no obvious gross SOB, gasping or wheezing  CV: no obvious cyanosis  MS: moves all visible extremities without noticeable abnormality  PSYCH/NEURO: pleasant and cooperative, no obvious depression or anxiety, speech and thought processing grossly intact  ASSESSMENT AND PLAN: Sinusitis, treat with a  Zpack. Written out of work today until 09-04-18. Alysia Penna, MD   Discussed the following assessment and plan:  No diagnosis found.     I discussed the assessment and treatment plan with the patient. The patient was provided an opportunity to ask questions and all were answered. The patient agreed with the plan and demonstrated an understanding of the instructions.   The patient was advised to call back or seek an in-person evaluation if the symptoms worsen or if the condition fails to improve as  anticipated.    Review of Systems     Objective:   Physical Exam        Assessment & Plan:

## 2018-10-10 ENCOUNTER — Encounter: Payer: Self-pay | Admitting: Family Medicine

## 2018-10-10 ENCOUNTER — Other Ambulatory Visit: Payer: Self-pay

## 2018-10-10 ENCOUNTER — Ambulatory Visit (INDEPENDENT_AMBULATORY_CARE_PROVIDER_SITE_OTHER): Payer: Self-pay | Admitting: Family Medicine

## 2018-10-10 DIAGNOSIS — M5441 Lumbago with sciatica, right side: Secondary | ICD-10-CM

## 2018-10-10 MED ORDER — METHYLPREDNISOLONE 4 MG PO TBPK: ORAL_TABLET | ORAL | 0 refills | Status: DC

## 2018-10-10 NOTE — Progress Notes (Signed)
Subjective:    Patient ID: [name] [name], female    DOB: 31-Dec-1970, 48 y.o.   MRN: [id number]  HPI Virtual Visit via Video Note  I connected with the patient on 10/10/18 at  9:00 AM EDT by a video enabled telemedicine application and verified that I am speaking with the correct person using two identifiers.  Location patient: home Location provider:work or home office Persons participating in the virtual visit: patient, provider  I discussed the limitations of evaluation and management by telemedicine and the availability of in person appointments. The patient expressed understanding and agreed to proceed.   HPI: Here to discuss pain in the right lower back that radiates down the right leg to the foot. This started last Wednesday,and she thinks it was brought on by trying to turn a heavy object at her job the day before. She has also had numbness down the right leg, but no weakness. She has taken Ibuprofen and Flexeril, and todayshe feels a little better. Of note she has had a lumbar fusion surgery on L4-5. She feels a little better today.    ROS: See pertinent positives and negatives per HPI.  Past Medical History:  Diagnosis Date  . ALLERGIC RHINITIS 06/26/2007  . ANXIETY 08/22/2007  . ASTHMA 06/26/2007   last attack 1 mo ago  . ASTHMA, WITH ACUTE EXACERBATION 07/26/2007  . DEPRESSION 08/22/2007  . DM (dermatomyositis)    sees Dr. Jerene Pitch (Rheum at [facility]) and Dr. Saundra Shelling (Derm at [facility])  . Fibromyalgia   . GERD (gastroesophageal reflux disease)   . HYPERGLYCEMIA 08/22/2007  . Hypertension   . MYALGIA 04/15/2010  . NICOTINE ADDICTION 06/26/2007  . NONSPECIFIC ABNORM RESULTS THYROID FUNCT STUDY 08/22/2007  . Sinusitis   . Spontaneous pneumothorax     Past Surgical History:  Procedure Laterality Date  . CERVICAL CONIZATION W/BX    . fallopian tubectomy Right 2005  . KNEE SURGERY    . LUMBAR FUSION  08/23/2013   L4 L5   DR BROOKS  . LUNG SURGERY      History  reviewed. No pertinent family history.   Current Outpatient Medications:  .  albuterol (VENTOLIN HFA) 108 (90 Base) MCG/ACT inhaler, INHALE 2 PUFFS BY MOUTH EVERY 4 HOURS AS NEEDED FOR WHEEZING OR SHORTNESS OF BREATH, Disp: 18 g, Rfl: 5 .  Calcium Carb-Cholecalciferol (CALCIUM 600 + D PO), Take by mouth. ( vitamin d 800 mg ), Disp: , Rfl:  .  cyclobenzaprine (FLEXERIL) 5 MG tablet, TAKE 1 TABLET(5 MG) BY MOUTH THREE TIMES DAILY AS NEEDED FOR MUSCLE SPASMS, Disp: 90 tablet, Rfl: 5 .  DiphenhydrAMINE HCl (BENADRYL PO), Take by mouth at bedtime., Disp: , Rfl:  .  hydrochlorothiazide (HYDRODIURIL) 25 MG tablet, Take 1 tablet (25 mg total) by mouth daily., Disp: 90 tablet, Rfl: 3 .  LORazepam (ATIVAN) 0.5 MG tablet, Take 1 tablet (0.5 mg total) by mouth 3 (three) times daily as needed for anxiety. for anxiety, Disp: 90 tablet, Rfl: 5 .  metoprolol succinate (TOPROL-XL) 25 MG 24 hr tablet, TAKE 1 TABLET(25 MG) BY MOUTH DAILY, Disp: 90 tablet, Rfl: 3 .  VITAMIN D, CHOLECALCIFEROL, PO, Take 2,000 mg by mouth 2 (two) times daily. , Disp: , Rfl:  .  methylPREDNISolone (MEDROL DOSEPAK) 4 MG TBPK tablet, As directed, Disp: 21 tablet, Rfl: 0  EXAM:  VITALS per patient if applicable:  GENERAL: alert, oriented, appears well and in no acute distress  HEENT: atraumatic, conjunttiva clear, no obvious abnormalities on inspection  of external nose and ears  NECK: normal movements of the head and neck  LUNGS: on inspection no signs of respiratory distress, breathing rate appears normal, no obvious gross SOB, gasping or wheezing  CV: no obvious cyanosis  MS: moves all visible extremities without noticeable abnormality  PSYCH/NEURO: pleasant and cooperative, no obvious depression or anxiety, speech and thought processing grossly intact  ASSESSMENT AND PLAN: Right sided sciatica. We will write her a note to be out of work on 10-05-18, 10-09-18, and 10-10-18. She will  Use heat and Flexeril prn. We will also give  her a Medrol dose pack. Recheck prn.  Alysia Penna, MD  Discussed the following assessment and plan:  No diagnosis found.     I discussed the assessment and treatment plan with the patient. The patient was provided an opportunity to ask questions and all were answered. The patient agreed with the plan and demonstrated an understanding of the instructions.   The patient was advised to call back or seek an in-person evaluation if the symptoms worsen or if the condition fails to improve as anticipated.     Review of Systems     Objective:   Physical Exam        Assessment & Plan:

## 2018-10-26 ENCOUNTER — Other Ambulatory Visit: Payer: Self-pay

## 2018-10-26 ENCOUNTER — Encounter: Payer: Self-pay | Admitting: Family Medicine

## 2018-10-26 ENCOUNTER — Telehealth (INDEPENDENT_AMBULATORY_CARE_PROVIDER_SITE_OTHER): Payer: Self-pay | Admitting: Family Medicine

## 2018-10-26 DIAGNOSIS — M5441 Lumbago with sciatica, right side: Secondary | ICD-10-CM

## 2018-10-26 MED ORDER — NAPROXEN 500 MG PO TABS: 500.0000 mg | ORAL_TABLET | Freq: Two times a day (BID) | ORAL | 0 refills | Status: DC

## 2018-10-26 NOTE — Progress Notes (Signed)
Exercises mailed to the pts home address.

## 2018-10-26 NOTE — Progress Notes (Signed)
Appt scheduled for 8/5 at 8am.

## 2018-10-26 NOTE — Patient Instructions (Addendum)
-  do the exercises provided at least 4 days per week  -Naproxen if needed for pain per instructions  -heat 15 minutes twice daily  -tiger balm (menthol) as needed  -follow up next week with PCP in office, sooner if any worsening        WORK SLIP:  [name] [name], DOB [birth year], was seen for a medical visit today, Thursday July 30th 2020. Please excuse her from work today and tomorrow.  Sincerely: Dr. Lucretia Kern, DO Please contact our office for any concerns or questions: [phone]

## 2018-10-26 NOTE — Progress Notes (Signed)
Virtual Visit via Video Note  I connected with [place]   on 10/26/18 at 11:20 AM EDT by a video enabled telemedicine application and verified that I am speaking with the correct person using two identifiers.  Location patient: home Location provider:work or home office Persons participating in the virtual visit: patient, provider  I discussed the limitations of evaluation and management by telemedicine and the availability of in person appointments. The patient expressed understanding and agreed to proceed.   HPI:  Acute visit for :sciatica" -Seen by PCP R sided pain with radicular symptoms on 10/10/18 and dx with sciatica  - treated with medrol dose pack, muscle relaxer, anagesic - Hx spinal fusion surgery; sees Dr. Rolena Infante -Today reports: not really doing much better - she has been working a different role and has to bend and twist more - she reports got better, but then got worse again 3 days ago and has stayed home -shooting pain in the R low back to the buttock, sometimes radiating down the back of the low back - 7 /10 when the shooting pain occurs -denies: weakness, numbness, bowel or bladder dysfunction, malaise, fevers  ROS: See pertinent positives and negatives per HPI.  Past Medical History:  Diagnosis Date  . ALLERGIC RHINITIS 06/26/2007  . ANXIETY 08/22/2007  . ASTHMA 06/26/2007   last attack 1 mo ago  . ASTHMA, WITH ACUTE EXACERBATION 07/26/2007  . DEPRESSION 08/22/2007  . DM (dermatomyositis)    sees Dr. Jerene Pitch (Rheum at [facility]) and Dr. Saundra Shelling (Derm at [facility])  . Fibromyalgia   . GERD (gastroesophageal reflux disease)   . HYPERGLYCEMIA 08/22/2007  . Hypertension   . MYALGIA 04/15/2010  . NICOTINE ADDICTION 06/26/2007  . NONSPECIFIC ABNORM RESULTS THYROID FUNCT STUDY 08/22/2007  . Sinusitis   . Spontaneous pneumothorax     Past Surgical History:  Procedure Laterality Date  . CERVICAL CONIZATION W/BX    . fallopian tubectomy Right 2005  . KNEE SURGERY    .  LUMBAR FUSION  08/23/2013   L4 L5   DR BROOKS  . LUNG SURGERY      No family history on file.  SOCIAL HX: see hpi   Current Outpatient Medications:  .  albuterol (VENTOLIN HFA) 108 (90 Base) MCG/ACT inhaler, INHALE 2 PUFFS BY MOUTH EVERY 4 HOURS AS NEEDED FOR WHEEZING OR SHORTNESS OF BREATH, Disp: 18 g, Rfl: 5 .  Calcium Carb-Cholecalciferol (CALCIUM 600 + D PO), Take by mouth. ( vitamin d 800 mg ), Disp: , Rfl:  .  cyclobenzaprine (FLEXERIL) 5 MG tablet, TAKE 1 TABLET(5 MG) BY MOUTH THREE TIMES DAILY AS NEEDED FOR MUSCLE SPASMS, Disp: 90 tablet, Rfl: 5 .  DiphenhydrAMINE HCl (BENADRYL PO), Take by mouth at bedtime., Disp: , Rfl:  .  hydrochlorothiazide (HYDRODIURIL) 25 MG tablet, Take 1 tablet (25 mg total) by mouth daily., Disp: 90 tablet, Rfl: 3 .  LORazepam (ATIVAN) 0.5 MG tablet, Take 1 tablet (0.5 mg total) by mouth 3 (three) times daily as needed for anxiety. for anxiety, Disp: 90 tablet, Rfl: 5 .  metoprolol succinate (TOPROL-XL) 25 MG 24 hr tablet, TAKE 1 TABLET(25 MG) BY MOUTH DAILY, Disp: 90 tablet, Rfl: 3 .  VITAMIN D, CHOLECALCIFEROL, PO, Take 2,000 mg by mouth 2 (two) times daily. , Disp: , Rfl:  .  naproxen (NAPROSYN) 500 MG tablet, Take 1 tablet (500 mg total) by mouth 2 (two) times daily with a meal., Disp: 30 tablet, Rfl: 0  EXAM:  VITALS per patient if applicable:  GENERAL: alert, oriented, appears well and in no acute distress  HEENT: atraumatic, conjunttiva clear, no obvious abnormalities on inspection of external nose and ears  NECK: normal movements of the head and neck  LUNGS: on inspection no signs of respiratory distress, breathing rate appears normal, no obvious gross SOB, gasping or wheezing  CV: no obvious cyanosis  MS: moves all visible extremities without noticeable abnormality  PSYCH/NEURO: pleasant and cooperative, no obvious depression or anxiety, speech and thought processing grossly intact  ASSESSMENT AND PLAN:  Discussed the following  assessment and plan:  Acute right-sided low back pain with right-sided sciatica   -we discussed possible serious and likely etiologies, workup and treatment, treatment risks and return precautions. Suspect lumbosacral radiculopathy and given her prior history and ongoing symptoms advised follow up with her back specialist and imaging. She declines and prefers to follow closely with her PCP and try other measures first. She requests a work note for today and tomorrow as reports is improving after being off work yesterday. 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    I discussed the assessment and treatment plan with the patient. The patient was provided an opportunity to ask questions and all were answered. The patient agreed with the plan and demonstrated an understanding of the instructions.   The patient was advised to call back or seek an in-person evaluation if the symptoms worsen or if the condition fails to improve as anticipated.   Follow up instructions: Advised assistant Wendie Simmer to help patient arrange the following: -low back home exercises sent to home -follow up with PCP next week   Lucretia Kern, DO   Patient Instructions  -do the exercises provided at least 4 days per week  -Naproxen if needed for pain per instructions  -heat 15 minutes twice daily  -tiger balm (menthol) as needed  -follow up next week with PCP in office, sooner if any worsening

## 2018-11-01 ENCOUNTER — Other Ambulatory Visit: Payer: Self-pay

## 2018-11-01 ENCOUNTER — Telehealth: Payer: Self-pay

## 2018-11-01 ENCOUNTER — Encounter: Payer: Self-pay | Admitting: Family Medicine

## 2018-11-01 ENCOUNTER — Ambulatory Visit (INDEPENDENT_AMBULATORY_CARE_PROVIDER_SITE_OTHER): Payer: Self-pay

## 2018-11-01 ENCOUNTER — Ambulatory Visit (INDEPENDENT_AMBULATORY_CARE_PROVIDER_SITE_OTHER): Payer: Self-pay | Admitting: Family Medicine

## 2018-11-01 VITALS — BP 110/70 | HR 88 | Temp 99.4°F | Wt 159.8 lb

## 2018-11-01 DIAGNOSIS — M5386 Other specified dorsopathies, lumbar region: Secondary | ICD-10-CM

## 2018-11-01 NOTE — Progress Notes (Signed)
   Subjective:    Patient ID: [name] [name], female    DOB: 1970-07-18, 48 y.o.   MRN: [id number]  HPI Here to follow up on right sided low back pain and right sciatica. 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 She is S/P a fusion at L4-5 in 2015. 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 She took a Medrol dose pack and muscle relaxers at first, then Naproxen, and the pain has improved quite a bit. Now she just takes Ibuprofen as needed. She still has mild pain and some numbness down the back of the right leg.    Review of Systems  Constitutional: Negative.   Respiratory: Negative.   Cardiovascular: Negative.   Musculoskeletal: Positive for back pain.  Neurological: Positive for numbness. Negative for weakness.       Objective:   Physical Exam Constitutional:      General: She is not in acute distress.    Appearance: Normal appearance.  Cardiovascular:     Rate and Rhythm: Normal rate and regular rhythm.     Pulses: Normal pulses.     Heart sounds: Normal heart sounds.  Pulmonary:     Effort: Pulmonary effort is normal.     Breath sounds: Normal breath sounds.  Musculoskeletal:     Comments: She is tender in the right lower back and over the right sciatic notch. ROM is full, and SLR are negative. Xrays of the LS spine show her hardware intact and are otherwise unremarkable.   Neurological:     Mental Status: She is alert.           Assessment & Plan:  Low back pain with sciatica. This was clearly exacerbated by the time she spent at work moving the heavy objects. She seems to be improving now, so she will follow up with [place] as needed. We wrote her a letter for work  to avoid lifting or moving anything greater than 20 lbs.  Alysia Penna, MD

## 2018-11-01 NOTE — Telephone Encounter (Signed)
Copied from [place] [id number]. Topic: General - Other >> Nov 01, 2018 10:28 AM Celene Kras A wrote: Reason for CRM: Pts employer called stating she is needing clarification on the the note that was sent to her today. Please advise.  [place] >> Nov 01, 2018  2:24 PM [place], Melburn Hake, CMA wrote: We cannot speak to employer without FMLA forms containing contact information and release or records release form allowing release of information.

## 2018-11-02 NOTE — Telephone Encounter (Signed)
Pt will come in office to sign a release and will wear a mask

## 2018-11-02 NOTE — Telephone Encounter (Signed)
Spoke with patient. She stated she will come into the office and sign a release form for [place] to speak with her employer.

## 2018-11-02 NOTE — Telephone Encounter (Signed)
Patient came into the office and was told we can not give any information to her employer even with a release form. I spoke with the patient and she called her employer while here in the office to see if the information needed was something she could get from [place]. Her employer [employer]) told the patient that she has not called our office. After checking with another staff member the patient stated that it was HR who called wanting to know if the restrictions are permanent or temporary.

## 2018-11-02 NOTE — Telephone Encounter (Signed)
Patient spoke to her employer herself and told them that it was a permanent restriction due to her back being fused. Nothing further needed.

## 2018-12-24 ENCOUNTER — Other Ambulatory Visit: Payer: Self-pay | Admitting: Family Medicine

## 2018-12-26 NOTE — Telephone Encounter (Signed)
Okay for refill?  

## 2019-01-15 ENCOUNTER — Other Ambulatory Visit: Payer: Self-pay | Admitting: Family Medicine

## 2019-03-30 ENCOUNTER — Other Ambulatory Visit: Payer: Self-pay | Admitting: Family Medicine

## 2019-04-18 ENCOUNTER — Other Ambulatory Visit: Payer: Self-pay | Admitting: Family Medicine

## 2019-04-24 ENCOUNTER — Encounter: Payer: Self-pay | Admitting: Family Medicine

## 2019-04-25 ENCOUNTER — Other Ambulatory Visit: Payer: Self-pay

## 2019-04-25 ENCOUNTER — Telehealth (INDEPENDENT_AMBULATORY_CARE_PROVIDER_SITE_OTHER): Payer: Self-pay | Admitting: Family Medicine

## 2019-04-25 DIAGNOSIS — I10 Essential (primary) hypertension: Secondary | ICD-10-CM

## 2019-04-25 MED ORDER — HYDROCHLOROTHIAZIDE 25 MG PO TABS: 25.0000 mg | ORAL_TABLET | Freq: Every day | ORAL | 3 refills | Status: DC

## 2019-04-25 MED ORDER — METOPROLOL SUCCINATE ER 50 MG PO TB24: 50.0000 mg | ORAL_TABLET | Freq: Every day | ORAL | 3 refills | Status: DC

## 2019-04-25 NOTE — Telephone Encounter (Signed)
Set up an OV so we can talk about this  

## 2019-04-25 NOTE — Progress Notes (Signed)
Virtual Visit via Video Note  I connected with the patient on 04/25/19 at  3:45 PM EST by a video enabled telemedicine application and verified that I am speaking with the correct person using two identifiers.  Location patient: home Location provider:work or home office Persons participating in the virtual visit: patient, provider  I discussed the limitations of evaluation and management by telemedicine and the availability of in person appointments. The patient expressed understanding and agreed to proceed.   HPI: Here with concerns about elevated BP. She has been taking Metoprolol succinate 25 mg daily and HCTZ 25 mg daily. She feels well but sometimes of her BP goes up she feels a general tiredness. No headaches or chest pain or SOB. No swelling in the hands or feet. She lost oher job a few months ago so she is now more sedentary around the house. Her weight is up to 172 lbs.    ROS: See pertinent positives and negatives per HPI.  Past Medical History:  Diagnosis Date  . ALLERGIC RHINITIS 06/26/2007  . ANXIETY 08/22/2007  . ASTHMA 06/26/2007   last attack 1 mo ago  . ASTHMA, WITH ACUTE EXACERBATION 07/26/2007  . DEPRESSION 08/22/2007  . DM (dermatomyositis)    sees Dr. Jerene Pitch (Rheum at [facility]) and Dr. Saundra Shelling (Derm at [facility])  . Fibromyalgia   . GERD (gastroesophageal reflux disease)   . HYPERGLYCEMIA 08/22/2007  . Hypertension   . MYALGIA 04/15/2010  . NICOTINE ADDICTION 06/26/2007  . NONSPECIFIC ABNORM RESULTS THYROID FUNCT STUDY 08/22/2007  . Sinusitis   . Spontaneous pneumothorax     Past Surgical History:  Procedure Laterality Date  . CERVICAL CONIZATION W/BX    . fallopian tubectomy Right 2005  . KNEE SURGERY    . LUMBAR FUSION  08/23/2013   L4 L5   DR BROOKS  . LUNG SURGERY      No family history on file.   Current Outpatient Medications:  .  albuterol (PROAIR HFA) 108 (90 Base) MCG/ACT inhaler, INHALE 2 PUFFS BY MOUTH EVERY 4 HOURS AS NEEDED FOR WHEEZING  OR SHORTNESS OF BREATH, Disp: 8.5 g, Rfl: 0 .  Calcium Carb-Cholecalciferol (CALCIUM 600 + D PO), Take by mouth. ( vitamin d 800 mg ), Disp: , Rfl:  .  cyclobenzaprine (FLEXERIL) 5 MG tablet, TAKE 1 TABLET(5 MG) BY MOUTH THREE TIMES DAILY AS NEEDED FOR MUSCLE SPASMS, Disp: 90 tablet, Rfl: 5 .  DiphenhydrAMINE HCl (BENADRYL PO), Take by mouth at bedtime., Disp: , Rfl:  .  hydrochlorothiazide (HYDRODIURIL) 25 MG tablet, Take 1 tablet (25 mg total) by mouth daily., Disp: 90 tablet, Rfl: 3 .  LORazepam (ATIVAN) 0.5 MG tablet, TAKE 1 TABLET(0.5 MG) BY MOUTH THREE TIMES DAILY AS NEEDED FOR ANXIETY, Disp: 90 tablet, Rfl: 5 .  metoprolol succinate (TOPROL-XL) 50 MG 24 hr tablet, Take 1 tablet (50 mg total) by mouth daily. Take with or immediately following a meal., Disp: 90 tablet, Rfl: 3 .  naproxen (NAPROSYN) 500 MG tablet, Take 1 tablet (500 mg total) by mouth 2 (two) times daily with a meal., Disp: 30 tablet, Rfl: 0 .  VITAMIN D, CHOLECALCIFEROL, PO, Take 2,000 mg by mouth 2 (two) times daily. , Disp: , Rfl:   EXAM:  VITALS per patient if applicable:  GENERAL: alert, oriented, appears well and in no acute distress  HEENT: atraumatic, conjunttiva clear, no obvious abnormalities on inspection of external nose and ears  NECK: normal movements of the head and neck  LUNGS: on inspection  no signs of respiratory distress, breathing rate appears normal, no obvious gross SOB, gasping or wheezing  CV: no obvious cyanosis  MS: moves all visible extremities without noticeable abnormality  PSYCH/NEURO: pleasant and cooperative, no obvious depression or anxiety, speech and thought processing grossly intact  ASSESSMENT AND PLAN: HTN. I advised her to exercise more and try to lose some weight. We will increase the Metoprolol succinate to 50 mg daily. Recheck in 2 weeks.  Alysia Penna, MD  Discussed the following assessment and plan:  No diagnosis found.     I discussed the assessment and treatment  plan with the patient. The patient was provided an opportunity to ask questions and all were answered. The patient agreed with the plan and demonstrated an understanding of the instructions.   The patient was advised to call back or seek an in-person evaluation if the symptoms worsen or if the condition fails to improve as anticipated.

## 2019-05-11 ENCOUNTER — Encounter: Payer: Self-pay | Admitting: Family Medicine

## 2019-05-12 NOTE — Telephone Encounter (Signed)
Tell her to increase this to 2 pills (or 100 mg) daily for 2 weeks and report back

## 2019-05-14 ENCOUNTER — Other Ambulatory Visit: Payer: Self-pay | Admitting: Family Medicine

## 2019-05-28 ENCOUNTER — Encounter: Payer: Self-pay | Admitting: Family Medicine

## 2019-05-29 NOTE — Telephone Encounter (Signed)
These numbers look great to me. We will stay at the 100 mg dosage. She can use up the 50 mg pills she has left. Also call in Metoprolol succinate 100 mg daily, #90 with 3 rf

## 2019-05-30 MED ORDER — METOPROLOL SUCCINATE ER 100 MG PO TB24: 100.0000 mg | ORAL_TABLET | Freq: Every day | ORAL | 3 refills | Status: DC

## 2019-06-04 ENCOUNTER — Other Ambulatory Visit: Payer: Self-pay | Admitting: Family Medicine

## 2019-07-09 ENCOUNTER — Other Ambulatory Visit: Payer: Self-pay | Admitting: Family Medicine

## 2019-07-09 NOTE — Telephone Encounter (Signed)
Last filled 12/26/2018 Last OV 04/25/2019  Ok to fill?

## 2019-07-24 ENCOUNTER — Other Ambulatory Visit: Payer: Self-pay | Admitting: Family Medicine

## 2019-08-21 ENCOUNTER — Other Ambulatory Visit: Payer: Self-pay | Admitting: Family Medicine

## 2019-09-06 ENCOUNTER — Other Ambulatory Visit: Payer: Self-pay

## 2019-09-07 ENCOUNTER — Encounter: Payer: Self-pay | Admitting: Family Medicine

## 2019-09-07 ENCOUNTER — Ambulatory Visit (INDEPENDENT_AMBULATORY_CARE_PROVIDER_SITE_OTHER): Payer: BLUE CROSS/BLUE SHIELD | Admitting: Family Medicine

## 2019-09-07 VITALS — BP 130/64 | HR 100 | Temp 98.2°F | Wt 176.0 lb

## 2019-09-07 DIAGNOSIS — Z0000 Encounter for general adult medical examination without abnormal findings: Secondary | ICD-10-CM | POA: Diagnosis not present

## 2019-09-07 LAB — CBC WITH DIFFERENTIAL/PLATELET
Basophils Absolute: 0.1 10*3/uL (ref 0.0–0.1)
Basophils Relative: 0.7 % (ref 0.0–3.0)
Eosinophils Absolute: 0.2 10*3/uL (ref 0.0–0.7)
Eosinophils Relative: 1.7 % (ref 0.0–5.0)
HCT: 41.6 % (ref 36.0–46.0)
Hemoglobin: 14.4 g/dL (ref 12.0–15.0)
Lymphocytes Relative: 28.2 % (ref 12.0–46.0)
Lymphs Abs: 2.9 10*3/uL (ref 0.7–4.0)
MCHC: 34.7 g/dL (ref 30.0–36.0)
MCV: 100.3 fl — ABNORMAL HIGH (ref 78.0–100.0)
Monocytes Absolute: 0.7 10*3/uL (ref 0.1–1.0)
Monocytes Relative: 6.6 % (ref 3.0–12.0)
Neutro Abs: 6.5 10*3/uL (ref 1.4–7.7)
Neutrophils Relative %: 62.8 % (ref 43.0–77.0)
Platelets: 332 10*3/uL (ref 150.0–400.0)
RBC: 4.15 Mil/uL (ref 3.87–5.11)
RDW: 13.1 % (ref 11.5–15.5)
WBC: 10.4 10*3/uL (ref 4.0–10.5)

## 2019-09-07 LAB — LIPID PANEL
Cholesterol: 174 mg/dL (ref 0–200)
HDL: 42 mg/dL (ref >39.00)
LDL Cholesterol: 97 mg/dL (ref 0–99)
NonHDL: 132.15
Total CHOL/HDL Ratio: 4
Triglycerides: 177 mg/dL — ABNORMAL HIGH (ref 0.0–149.0)
VLDL: 35.4 mg/dL (ref 0.0–40.0)

## 2019-09-07 LAB — HEPATIC FUNCTION PANEL
ALT: 35 U/L (ref 0–35)
AST: 29 U/L (ref 0–37)
Albumin: 4.5 g/dL (ref 3.5–5.2)
Alkaline Phosphatase: 72 U/L (ref 39–117)
Bilirubin, Direct: 0.1 mg/dL (ref 0.0–0.3)
Total Bilirubin: 0.6 mg/dL (ref 0.2–1.2)
Total Protein: 7.1 g/dL (ref 6.0–8.3)

## 2019-09-07 LAB — BASIC METABOLIC PANEL
BUN: 9 mg/dL (ref 6–23)
CO2: 26 mEq/L (ref 19–32)
Calcium: 9.5 mg/dL (ref 8.4–10.5)
Chloride: 102 mEq/L (ref 96–112)
Creatinine, Ser: 0.59 mg/dL (ref 0.40–1.20)
GFR: 107.66 mL/min (ref >60.00)
Glucose, Bld: 94 mg/dL (ref 70–99)
Potassium: 4.1 mEq/L (ref 3.5–5.1)
Sodium: 136 mEq/L (ref 135–145)

## 2019-09-07 LAB — TSH: TSH: 0.74 u[IU]/mL (ref 0.35–4.50)

## 2019-09-07 LAB — VITAMIN D 25 HYDROXY (VIT D DEFICIENCY, FRACTURES): VITD: 37.47 ng/mL (ref 30.00–100.00)

## 2019-09-07 LAB — VITAMIN B12: Vitamin B-12: 880 pg/mL (ref 211–911)

## 2019-09-07 MED ORDER — COMBIVENT RESPIMAT 20-100 MCG/ACT IN AERS: 1.0000 | INHALATION_SPRAY | Freq: Four times a day (QID) | RESPIRATORY_TRACT | 5 refills | Status: DC

## 2019-09-07 NOTE — Progress Notes (Signed)
   Subjective:    Patient ID: [name] [name], female    DOB: 06/28/1970, 49 y.o.   MRN: [id number]  HPI Here for a well exam. She is doing well. Her asthma has been acting up a little with the increased humidity outside. She has tried a Combivent inhaler that a friend gave her, and she says this helps better than albuterol does. Her BP has been stable.    Review of Systems  Constitutional: Negative.   HENT: Negative.   Eyes: Negative.   Respiratory: Negative.   Cardiovascular: Negative.   Gastrointestinal: Negative.   Genitourinary: Negative for decreased urine volume, difficulty urinating, dyspareunia, dysuria, enuresis, flank pain, frequency, hematuria, pelvic pain and urgency.  Musculoskeletal: Negative.   Skin: Negative.   Neurological: Negative.   Psychiatric/Behavioral: Negative.        Objective:   Physical Exam Constitutional:      General: She is not in acute distress.    Appearance: She is well-developed.  HENT:     Head: Normocephalic and atraumatic.     Right Ear: External ear normal.     Left Ear: External ear normal.     Nose: Nose normal.     Mouth/Throat:     Pharynx: No oropharyngeal exudate.  Eyes:     General: No scleral icterus.    Conjunctiva/sclera: Conjunctivae normal.     Pupils: Pupils are equal, round, and reactive to light.  Neck:     Thyroid: No thyromegaly.     Vascular: No JVD.  Cardiovascular:     Rate and Rhythm: Normal rate and regular rhythm.     Heart sounds: Normal heart sounds. No murmur heard.  No friction rub. No gallop.   Pulmonary:     Effort: Pulmonary effort is normal. No respiratory distress.     Breath sounds: Normal breath sounds. No wheezing or rales.  Chest:     Chest wall: No tenderness.  Abdominal:     General: Bowel sounds are normal. There is no distension.     Palpations: Abdomen is soft. There is no mass.     Tenderness: There is no abdominal tenderness. There is no guarding or rebound.  Musculoskeletal:         General: No tenderness. Normal range of motion.     Cervical back: Normal range of motion and neck supple.  Lymphadenopathy:     Cervical: No cervical adenopathy.  Skin:    General: Skin is warm and dry.     Findings: No erythema or rash.  Neurological:     Mental Status: She is alert and oriented to person, place, and time.     Cranial Nerves: No cranial nerve deficit.     Motor: No abnormal muscle tone.     Coordination: Coordination normal.     Deep Tendon Reflexes: Reflexes are normal and symmetric. Reflexes normal.  Psychiatric:        Behavior: Behavior normal.        Thought Content: Thought content normal.        Judgment: Judgment normal.           Assessment & Plan:  Well exam. We discussed diet and exercise. Get fasting labs. She will try Combivent as needed. Set up a colonoscopy.  Alysia Penna, MD

## 2019-09-10 LAB — HEMOGLOBIN A1C: Hgb A1c MFr Bld: 5.8 % (ref 4.6–6.5)

## 2019-10-07 ENCOUNTER — Other Ambulatory Visit: Payer: Self-pay | Admitting: Family Medicine

## 2019-10-08 NOTE — Telephone Encounter (Signed)
Please advise. This Rx is not on the patients med list

## 2019-10-31 ENCOUNTER — Encounter: Payer: BLUE CROSS/BLUE SHIELD | Admitting: Gastroenterology

## 2019-12-05 ENCOUNTER — Other Ambulatory Visit: Payer: Self-pay | Admitting: Family Medicine

## 2020-01-03 ENCOUNTER — Encounter: Payer: Self-pay | Admitting: Family Medicine

## 2020-01-03 ENCOUNTER — Telehealth (INDEPENDENT_AMBULATORY_CARE_PROVIDER_SITE_OTHER): Payer: BLUE CROSS/BLUE SHIELD | Admitting: Family Medicine

## 2020-01-03 DIAGNOSIS — R059 Cough, unspecified: Secondary | ICD-10-CM | POA: Diagnosis not present

## 2020-01-03 DIAGNOSIS — E559 Vitamin D deficiency, unspecified: Secondary | ICD-10-CM

## 2020-01-03 DIAGNOSIS — R0981 Nasal congestion: Secondary | ICD-10-CM | POA: Diagnosis not present

## 2020-01-03 DIAGNOSIS — J302 Other seasonal allergic rhinitis: Secondary | ICD-10-CM

## 2020-01-03 DIAGNOSIS — R519 Headache, unspecified: Secondary | ICD-10-CM | POA: Diagnosis not present

## 2020-01-03 DIAGNOSIS — J45901 Unspecified asthma with (acute) exacerbation: Secondary | ICD-10-CM | POA: Diagnosis not present

## 2020-01-03 MED ORDER — DOXYCYCLINE HYCLATE 100 MG PO TABS
100.0000 mg | ORAL_TABLET | Freq: Two times a day (BID) | ORAL | 0 refills | Status: DC
Start: 2020-01-03 — End: 2020-01-22

## 2020-01-03 NOTE — Progress Notes (Signed)
Virtual Visit via Video Note  I connected with [name] [name]  on 01/03/20 at 10:00 AM EDT by a video enabled telemedicine application and verified that I am speaking with the correct person using two identifiers.  Location patient: home, [place] Location provider:work or home office Persons participating in the virtual visit: patient, provider  I discussed the limitations of evaluation and management by telemedicine and the availability of in person appointments. The patient expressed understanding and agreed to proceed.   HPI:  Acute telemedicine visit for Sinus Congestion: -Onset: a few weeks ago -Symptoms include: sinus congestion, PND, cough, increasing mucus and now sinus pain, increased inhaler use the last few weeks -Denies: fevers, SOB, CP currently - did have some SOB a few nights ago, no hemoptysis, NVD, loss of taste or smell, no sick contacts -had covid test last Friday negative -Pertinent past medical history: has Asthma - uses albuterol and/or combivent, also has seasonal allergies and struggles at this time of the year -Pertinent medication allergies: Codeine -COVID-19 vaccine status: Reports she is fully vaccinated -Taking her vitamin D daily -Reports over-the-counter cough medication is sufficient for the cough  ROS: See pertinent positives and negatives per HPI.  Past Medical History:  Diagnosis Date  . ALLERGIC RHINITIS 06/26/2007  . ANXIETY 08/22/2007  . ASTHMA 06/26/2007   last attack 1 mo ago  . ASTHMA, WITH ACUTE EXACERBATION 07/26/2007  . DEPRESSION 08/22/2007  . DM (dermatomyositis)    sees Dr. Jerene Pitch (Rheum at [facility]) and Dr. Saundra Shelling (Derm at [facility])  . Fibromyalgia   . GERD (gastroesophageal reflux disease)   . HYPERGLYCEMIA 08/22/2007  . Hypertension   . MYALGIA 04/15/2010  . NICOTINE ADDICTION 06/26/2007  . NONSPECIFIC ABNORM RESULTS THYROID FUNCT STUDY 08/22/2007  . Sinusitis   . Spontaneous pneumothorax     Past Surgical History:  Procedure  Laterality Date  . CERVICAL CONIZATION W/BX    . fallopian tubectomy Right 2005  . KNEE SURGERY    . LUMBAR FUSION  08/23/2013   L4 L5   DR BROOKS  . LUNG SURGERY       Current Outpatient Medications:  .  albuterol (VENTOLIN HFA) 108 (90 Base) MCG/ACT inhaler, INHALE 2 PUFFS BY MOUTH EVERY 4 HOURS AS NEEDED FOR WHEEZING OR SHORTNESS OF BREATH, Disp: 8.5 g, Rfl: 5 .  Calcium Carb-Cholecalciferol (CALCIUM 600 + D PO), Take by mouth. ( vitamin d 800 mg ), Disp: , Rfl:  .  cyanocobalamin 1000 MCG tablet, Take 1,000 mcg by mouth daily., Disp: , Rfl:  .  cyclobenzaprine (FLEXERIL) 5 MG tablet, TAKE 1 TABLET(5 MG) BY MOUTH THREE TIMES DAILY AS NEEDED FOR MUSCLE SPASMS, Disp: 90 tablet, Rfl: 5 .  hydrochlorothiazide (HYDRODIURIL) 25 MG tablet, Take 1 tablet (25 mg total) by mouth daily., Disp: 90 tablet, Rfl: 3 .  Ipratropium-Albuterol (COMBIVENT RESPIMAT) 20-100 MCG/ACT AERS respimat, Inhale 1 puff into the lungs every 6 (six) hours., Disp: 8 g, Rfl: 5 .  loratadine (CLARITIN) 10 MG tablet, Take 10 mg by mouth daily., Disp: , Rfl:  .  LORazepam (ATIVAN) 0.5 MG tablet, TAKE 1 TABLET(0.5 MG) BY MOUTH THREE TIMES DAILY AS NEEDED FOR ANXIETY, Disp: 90 tablet, Rfl: 5 .  metoprolol succinate (TOPROL-XL) 100 MG 24 hr tablet, Take 1 tablet (100 mg total) by mouth daily. Take with or immediately following a meal., Disp: 90 tablet, Rfl: 3 .  Multiple Vitamin (MULTIVITAMIN) tablet, Take 1 tablet by mouth daily., Disp: , Rfl:  .  VITAMIN D, CHOLECALCIFEROL, PO,  Take 2,000 mg by mouth 2 (two) times daily. , Disp: , Rfl:  .  doxycycline (VIBRA-TABS) 100 MG tablet, Take 1 tablet (100 mg total) by mouth 2 (two) times daily., Disp: 20 tablet, Rfl: 0  EXAM:  VITALS per patient if applicable:  GENERAL: alert, oriented, appears well and in no acute distress  HEENT: atraumatic, conjunttiva clear, no obvious abnormalities on inspection of external nose and ears  NECK: normal movements of the head and  neck  LUNGS: on inspection no signs of respiratory distress, breathing rate appears normal, no obvious gross SOB, gasping or wheezing  CV: no obvious cyanosis  MS: moves all visible extremities without noticeable abnormality  PSYCH/NEURO: pleasant and cooperative, no obvious depression or anxiety, speech and thought processing grossly intact  ASSESSMENT AND PLAN:  Discussed the following assessment and plan:  Cough  Nasal congestion  Facial pain  Asthma with acute exacerbation, unspecified asthma severity, unspecified whether persistent  Vitamin D deficiency  Seasonal allergies  -we discussed possible serious and likely etiologies, options for evaluation and workup, limitations of telemedicine visit vs in person visit, treatment, treatment risks and precautions. Pt prefers to treat via telemedicine empirically rather than in person at this moment.  Query developing bacterial sinusitis, possible lower respiratory infection versus other.  Possibly triggered by underlying allergies or a viral infection.  She also seems to be having some difficulties with her asthma with this illness.  She opted for empiric treatment with doxycycline 100 mg twice daily for 7 to 10 days.  For her allergies, advised her antihistamine daily along with starting Flonase 2 sprays each nostril daily for 1 month, followed by 1 spray each nostril daily.  Advised continuation of her vitamin D for her chronic vitamin D deficiency.  Albuterol every 4 to 6 hours as needed. Work/School slipped offered: provided in patient instructions   Scheduled follow up with PCP offered: Agrees to follow-up if any worsening or is not improving with treatment Advised to seek prompt follow up telemedicine visit or in person care if worsening, new symptoms arise, or if is not improving with treatment. Did let this patient know that I only do telemedicine on Tuesdays and Thursdays for [place]. Advised to schedule follow up visit with PCP  or [facility] if any further questions or concerns to avoid delays in care.   I discussed the assessment and treatment plan with the patient. The patient was provided an opportunity to ask questions and all were answered. The patient agreed with the plan and demonstrated an understanding of the instructions.     Lucretia Kern, DO

## 2020-01-03 NOTE — Patient Instructions (Addendum)
   ---------------------------------------------------------------------------------------------------------------------------      WORK SLIP:  Patient [name] [name],  11-24-1970, was seen for a medical visit today, 01/03/20 . Symptoms started several weeks ago. Patient reports she already had a negative COVID19 test. She can return to work on 01/07/2020 if symptoms have resolved.  Sincerely: E-signature: Dr. Colin Benton, DO [place] Ph: [phone]   ------------------------------------------------------------------------------------------------------------------------------     Home Care Tips and Instructions:  -stay home while sick  -Take your Claritin daily  -start Flonase, 2 sprays each nostril daily for the first month, followed by 1 spray each nostril daily  -Use your inhaler as instructed; seek prompt in person care if any asthma symptoms or difficulties breathing that do not respond to your inhaler.  -I sent the medication(s) we discussed to your pharmacy: Meds ordered this encounter  Medications  . doxycycline (VIBRA-TABS) 100 MG tablet    Sig: Take 1 tablet (100 mg total) by mouth 2 (two) times daily.    Dispense:  20 tablet    Refill:  0    -can use tylenol or aleve if needed for fevers, aches and pains per instructions  -can use nasal saline a few times per day if nasal congestion  -stay hydrated, drink plenty of fluids and eat small healthy meals - avoid dairy  -Please continue your baseline vitamin D and can also take Vit C 500 mg daily   -follow up with your doctor in 2-3 days unless improving and feeling better  I hope you are feeling better soon! Seek in-person care or a follow up telemedicine visit promptly if your symptoms worsen, new concerns arise or you are not improving as expected. Call 911 if severe symptoms.

## 2020-01-04 NOTE — Telephone Encounter (Signed)
Patient has been seen. Patient called the office and scheduled a virtual visit with Dr. Maudie Mercury  for 01/03/20.

## 2020-01-07 ENCOUNTER — Telehealth: Payer: Self-pay | Admitting: *Deleted

## 2020-01-07 NOTE — Telephone Encounter (Addendum)
Patient called after hours line this morning. Patient reports she was dx'd with she was diagnosed with a sinus infection and walking pneumonia last week and was started on antibiotics. She was offered steroids during visit but declined.  Caller is requesting to speak with the Physician and have steroids called in.   Patient had virtual visit with Dr. Maudie Mercury on 01/03/2020. Please advise

## 2020-01-08 ENCOUNTER — Encounter: Payer: Self-pay | Admitting: Family Medicine

## 2020-01-08 ENCOUNTER — Other Ambulatory Visit: Payer: Self-pay | Admitting: Family Medicine

## 2020-01-08 ENCOUNTER — Telehealth (INDEPENDENT_AMBULATORY_CARE_PROVIDER_SITE_OTHER): Payer: BLUE CROSS/BLUE SHIELD | Admitting: Family Medicine

## 2020-01-08 VITALS — Ht 67.0 in

## 2020-01-08 DIAGNOSIS — R059 Cough, unspecified: Secondary | ICD-10-CM

## 2020-01-08 DIAGNOSIS — J45901 Unspecified asthma with (acute) exacerbation: Secondary | ICD-10-CM | POA: Diagnosis not present

## 2020-01-08 MED ORDER — PREDNISONE 20 MG PO TABS
40.0000 mg | ORAL_TABLET | Freq: Every day | ORAL | 0 refills | Status: DC
Start: 2020-01-08 — End: 2020-01-22

## 2020-01-08 NOTE — Patient Instructions (Signed)
   ---------------------------------------------------------------------------------------------------------------------------      WORK SLIP:  Patient [name] [name],  [birth year], was seen for a medical visit today, 01/08/20 . Please excuse from work until 01/10/20 and until symptoms are improved. I have recommend a follow with her primary care doctor in 2-3 days.  Sincerely: E-signature: Dr. Colin Benton, DO [place] Ph: [phone]   ------------------------------------------------------------------------------------------------------------------------------     -I sent the medication(s) we discussed to your pharmacy: Meds ordered this encounter  Medications  . predniSONE (DELTASONE) 20 MG tablet    Sig: Take 2 tablets (40 mg total) by mouth daily with breakfast.    Dispense:  8 tablet    Refill:  0    I hope you are feeling better soon! Seek care promptly if your symptoms worsen, new concerns arise or you are not improving with treatment.

## 2020-01-08 NOTE — Progress Notes (Signed)
Virtual Visit via Video Note  I connected with [name] [name]  on 01/08/20 at 10:00 AM EDT by a video enabled telemedicine application and verified that I am speaking with the correct person using two identifiers.  Location patient: home Location provider:work or home office Persons participating in the virtual visit: patient, provider  I discussed the limitations of evaluation and management by telemedicine and the availability of in person appointments. The patient expressed understanding and agreed to proceed.   HPI:  Acute telemedicine visit for : -Onset: several weeks ago, since starting an antibiotic on 10/8 is feeling better - but is still having some asthma symptoms and feels like is not all better, using albuterol - helps some but does not resolve it, clear nasal congestion, mucus in cough is now clear -Denies: fever, hemoptysis, CP (except discomfort she feels is asthma), SOB, vomiting, nausea, weakness -she does not like taking combivent -Pertinent past medical history: -Pertinent medication allergies: codeine -COVID-19 vaccine status: fully vaccinated -had negative covid19 test  ROS: See pertinent positives and negatives per HPI.  Past Medical History:  Diagnosis Date  . ALLERGIC RHINITIS 06/26/2007  . ANXIETY 08/22/2007  . ASTHMA 06/26/2007   last attack 1 mo ago  . ASTHMA, WITH ACUTE EXACERBATION 07/26/2007  . DEPRESSION 08/22/2007  . DM (dermatomyositis)    sees Dr. Jerene Pitch (Rheum at [facility]) and Dr. Saundra Shelling (Derm at [facility])  . Fibromyalgia   . GERD (gastroesophageal reflux disease)   . HYPERGLYCEMIA 08/22/2007  . Hypertension   . MYALGIA 04/15/2010  . NICOTINE ADDICTION 06/26/2007  . NONSPECIFIC ABNORM RESULTS THYROID FUNCT STUDY 08/22/2007  . Sinusitis   . Spontaneous pneumothorax     Past Surgical History:  Procedure Laterality Date  . CERVICAL CONIZATION W/BX    . fallopian tubectomy Right 2005  . KNEE SURGERY    . LUMBAR FUSION  08/23/2013   L4 L5   DR  BROOKS  . LUNG SURGERY       Current Outpatient Medications:  .  albuterol (VENTOLIN HFA) 108 (90 Base) MCG/ACT inhaler, INHALE 2 PUFFS BY MOUTH EVERY 4 HOURS AS NEEDED FOR WHEEZING OR SHORTNESS OF BREATH, Disp: 8.5 g, Rfl: 5 .  Calcium Carb-Cholecalciferol (CALCIUM 600 + D PO), Take by mouth. ( vitamin d 800 mg ), Disp: , Rfl:  .  cyanocobalamin 1000 MCG tablet, Take 1,000 mcg by mouth daily., Disp: , Rfl:  .  cyclobenzaprine (FLEXERIL) 5 MG tablet, TAKE 1 TABLET(5 MG) BY MOUTH THREE TIMES DAILY AS NEEDED FOR MUSCLE SPASMS, Disp: 90 tablet, Rfl: 5 .  doxycycline (VIBRA-TABS) 100 MG tablet, Take 1 tablet (100 mg total) by mouth 2 (two) times daily., Disp: 20 tablet, Rfl: 0 .  hydrochlorothiazide (HYDRODIURIL) 25 MG tablet, Take 1 tablet (25 mg total) by mouth daily., Disp: 90 tablet, Rfl: 3 .  Ipratropium-Albuterol (COMBIVENT RESPIMAT) 20-100 MCG/ACT AERS respimat, Inhale 1 puff into the lungs every 6 (six) hours., Disp: 8 g, Rfl: 5 .  loratadine (CLARITIN) 10 MG tablet, Take 10 mg by mouth daily., Disp: , Rfl:  .  LORazepam (ATIVAN) 0.5 MG tablet, TAKE 1 TABLET(0.5 MG) BY MOUTH THREE TIMES DAILY AS NEEDED FOR ANXIETY, Disp: 90 tablet, Rfl: 5 .  metoprolol succinate (TOPROL-XL) 100 MG 24 hr tablet, Take 1 tablet (100 mg total) by mouth daily. Take with or immediately following a meal., Disp: 90 tablet, Rfl: 3 .  Multiple Vitamin (MULTIVITAMIN) tablet, Take 1 tablet by mouth daily., Disp: , Rfl:  .  VITAMIN D,  CHOLECALCIFEROL, PO, Take 2,000 mg by mouth 2 (two) times daily. , Disp: , Rfl:  .  predniSONE (DELTASONE) 20 MG tablet, Take 2 tablets (40 mg total) by mouth daily with breakfast., Disp: 8 tablet, Rfl: 0  EXAM:  VITALS per patient if applicable:  GENERAL: alert, oriented, appears well and in no acute distress  HEENT: atraumatic, conjunttiva clear, no obvious abnormalities on inspection of external nose and ears  NECK: normal movements of the head and neck  LUNGS: on inspection no  signs of respiratory distress, breathing rate appears normal, no obvious gross SOB, gasping or wheezing  CV: no obvious cyanosis  MS: moves all visible extremities without noticeable abnormality  PSYCH/NEURO: pleasant and cooperative, no obvious depression or anxiety, speech and thought processing grossly intact  ASSESSMENT AND PLAN:  Discussed the following assessment and plan:  Cough  Asthma with acute exacerbation, unspecified asthma severity, unspecified whether persistent  -we discussed possible serious and likely etiologies, options for evaluation and workup, limitations of telemedicine visit vs in person visit, treatment, treatment risks and precautions. Pt prefers to treat via telemedicine empirically rather than in person at this moment.  Query asthma exacerbation vs other. Seems symptoms of possible bacterial component have subsided and improved with abx. She is requesting a course of prednisone which seems reasonable. Rx sent for prednisone burst 40mg  daily x 4 days. Advised PCP follow up in 2-3 days to recheck given duration symptoms.  Work/School slipped offered: provided in patient instructions   Scheduled follow up with PCP offered:  Sent message to schedulers to assist and advised patient to contact PCP office to schedule if does not receive call back in next 24 hours.  Advised to seek prompt follow up telemedicine visit or in person care if worsening, new symptoms arise, or if is not improving with treatment. Did let this patient know that I only do telemedicine on Tuesdays and Thursdays for [place]. Advised to schedule follow up visit with PCP or [facility] if any further questions or concerns to avoid delays in care.   I discussed the assessment and treatment plan with the patient. The patient was provided an opportunity to ask questions and all were answered. The patient agreed with the plan and demonstrated an understanding of the instructions.     Lucretia Kern, DO

## 2020-01-09 NOTE — Telephone Encounter (Signed)
I see she had another video visit with Dr. Donalee Citrin she was given Prednisone already

## 2020-01-18 ENCOUNTER — Other Ambulatory Visit: Payer: Self-pay | Admitting: Family Medicine

## 2020-01-22 ENCOUNTER — Encounter: Payer: Self-pay | Admitting: Family Medicine

## 2020-01-22 ENCOUNTER — Telehealth (INDEPENDENT_AMBULATORY_CARE_PROVIDER_SITE_OTHER): Payer: BLUE CROSS/BLUE SHIELD | Admitting: Family Medicine

## 2020-01-22 VITALS — Temp 97.4°F | Wt 184.0 lb

## 2020-01-22 DIAGNOSIS — R059 Cough, unspecified: Secondary | ICD-10-CM | POA: Diagnosis not present

## 2020-01-22 DIAGNOSIS — J302 Other seasonal allergic rhinitis: Secondary | ICD-10-CM | POA: Diagnosis not present

## 2020-01-22 DIAGNOSIS — J45909 Unspecified asthma, uncomplicated: Secondary | ICD-10-CM

## 2020-01-22 NOTE — Progress Notes (Signed)
Virtual Visit via Video Note  I connected with [name] [name]  on 01/22/20 at 10:00 AM EDT by a video enabled telemedicine application and verified that I am speaking with the correct person using two identifiers.  Location patient: home, [place] Location provider:work or home office Persons participating in the virtual visit: patient, provider  I discussed the limitations of evaluation and management by telemedicine and the availability of in person appointments. The patient expressed understanding and agreed to proceed.   HPI:  Acute telemedicine visit for : -Onset: 2 days ago (had resp illness earlier this month but reports had been doing great after that) -Symptoms include: sinus congestion, cough, laryngitis, asthma symptoms -She feels like whenever she takes her combivent it causes asthma symptoms - she stopped it a few days ago and she feels much better the last few days -still feels a little tired with a cough -temp 97.4 -Denies: SOB, CP, wheezing, body aches, fevers, inability to get out of bed/eat/drink -Has tried: she has used albuterol when she needs it -Pertinent past medical history:asthma, reports she usually does well except in the summer and prefers to not take a maintenance drug -Pertinent medication allergies: codeine -COVID-19 vaccine status: fully vaccinated  ROS: See pertinent positives and negatives per HPI.  Past Medical History:  Diagnosis Date  . ALLERGIC RHINITIS 06/26/2007  . ANXIETY 08/22/2007  . ASTHMA 06/26/2007   last attack 1 mo ago  . ASTHMA, WITH ACUTE EXACERBATION 07/26/2007  . DEPRESSION 08/22/2007  . DM (dermatomyositis)    sees Dr. Jerene Pitch (Rheum at [facility]) and Dr. Saundra Shelling (Derm at [facility])  . Fibromyalgia   . GERD (gastroesophageal reflux disease)   . HYPERGLYCEMIA 08/22/2007  . Hypertension   . MYALGIA 04/15/2010  . NICOTINE ADDICTION 06/26/2007  . NONSPECIFIC ABNORM RESULTS THYROID FUNCT STUDY 08/22/2007  . Sinusitis   . Spontaneous  pneumothorax     Past Surgical History:  Procedure Laterality Date  . CERVICAL CONIZATION W/BX    . fallopian tubectomy Right 2005  . KNEE SURGERY    . LUMBAR FUSION  08/23/2013   L4 L5   DR BROOKS  . LUNG SURGERY       Current Outpatient Medications:  .  albuterol (VENTOLIN HFA) 108 (90 Base) MCG/ACT inhaler, INHALE 2 PUFFS BY MOUTH EVERY 4 HOURS AS NEEDED FOR WHEEZING OR SHORTNESS OF BREATH, Disp: 8.5 g, Rfl: 5 .  Calcium Carb-Cholecalciferol (CALCIUM 600 + D PO), Take by mouth. ( vitamin d 800 mg ), Disp: , Rfl:  .  cyanocobalamin 1000 MCG tablet, Take 1,000 mcg by mouth daily., Disp: , Rfl:  .  cyclobenzaprine (FLEXERIL) 5 MG tablet, TAKE 1 TABLET(5 MG) BY MOUTH THREE TIMES DAILY AS NEEDED FOR MUSCLE SPASMS, Disp: 90 tablet, Rfl: 5 .  hydrochlorothiazide (HYDRODIURIL) 25 MG tablet, Take 1 tablet (25 mg total) by mouth daily., Disp: 90 tablet, Rfl: 3 .  Ipratropium-Albuterol (COMBIVENT RESPIMAT) 20-100 MCG/ACT AERS respimat, Inhale 1 puff into the lungs every 6 (six) hours., Disp: 8 g, Rfl: 5 .  loratadine (CLARITIN) 10 MG tablet, Take 10 mg by mouth daily., Disp: , Rfl:  .  LORazepam (ATIVAN) 0.5 MG tablet, TAKE 1 TABLET(0.5 MG) BY MOUTH THREE TIMES DAILY AS NEEDED FOR ANXIETY, Disp: 90 tablet, Rfl: 5 .  metoprolol succinate (TOPROL-XL) 100 MG 24 hr tablet, Take 1 tablet (100 mg total) by mouth daily. Take with or immediately following a meal., Disp: 90 tablet, Rfl: 3 .  Multiple Vitamin (MULTIVITAMIN) tablet, Take 1 tablet  by mouth daily., Disp: , Rfl:  .  VITAMIN D, CHOLECALCIFEROL, PO, Take 2,000 mg by mouth 2 (two) times daily. , Disp: , Rfl:   EXAM:  VITALS per patient if applicable:  GENERAL: alert, oriented, appears well and in no acute distress  HEENT: atraumatic, conjunttiva clear, no obvious abnormalities on inspection of external nose and ears  NECK: normal movements of the head and neck  LUNGS: on inspection no signs of respiratory distress, breathing rate appears  normal, no obvious gross SOB, gasping or wheezing  CV: no obvious cyanosis  MS: moves all visible extremities without noticeable abnormality  PSYCH/NEURO: pleasant and cooperative, no obvious depression or anxiety, speech and thought processing grossly intact  ASSESSMENT AND PLAN:  Discussed the following assessment and plan:  No diagnosis found.  -we discussed possible serious and likely etiologies, options for evaluation and workup, limitations of telemedicine visit vs in person visit, treatment, treatment risks and precautions. Pt prefers to treat via telemedicine empirically rather than in person at this moment. It seems like she is doing better today. Query new acute VURI vs allergies vs other. She prefers to avoid a maintenance medication and reports will not be taking the combivent. She feels alb works well when does have asthma symptoms. Today no SOB, wheezing, malaise or severe symptoms. Discussed OTC meds for nasal congestion and cough and nasal saline. Advised follow up with PCP in the next 2-4 weeks to check in. Advised home isolation while sick, covid testing, return precuations.  Work/School slipped offered: provided in patient instructions  Scheduled follow up with PCP offered:  Sent message to schedulers to assist and advised patient to contact PCP office to schedule if does not receive call back in next 24 hours. Advised to seek prompt follow up telemedicine visit or in person care if worsening, new symptoms arise, or if is not improving with treatment. Did let this patient know that I only do telemedicine on Tuesdays and Thursdays for [place]. Advised to schedule follow up visit with PCP or [facility] if any further questions or concerns to avoid delays in care.   I discussed the assessment and treatment plan with the patient. The patient was provided an opportunity to ask questions and all were answered. The patient agreed with the plan and demonstrated an understanding of the  instructions.     Lucretia Kern, DO

## 2020-01-22 NOTE — Patient Instructions (Signed)
   ---------------------------------------------------------------------------------------------------------------------------      WORK SLIP:  Patient [name] [name],  May 07, 1970, was seen for a medical visit today, 01/22/20 . Please excuse from work according to the [facility] guidelines for a COVID like illness. We advise 10 days minimum from the onset of symptoms (01/18/20) PLUS 1 day of no fever and improved symptoms. Will defer to employer for a sooner return to work if [place] testing is negative and the symptoms have resolved. Advise following CDC guidelines.    Sincerely: E-signature: Dr. Colin Benton, DO [facility] Ph: [phone]   ------------------------------------------------------------------------------------------------------------------------------   -use your albuterol as needed per instructions  -follow up with Dr. Sarajane Jews for chronic asthma in 2-4 weeks, call office to schedule your appointment, I sent a message to Dr. Barbie Banner office as well.  -stay home while sick, and if you have [place] please stay home for a full 10 days since the onset of symptoms PLUS one day of no fever and feeling better  -[place] COVID19 testing information: https://www.rivera-powers.org/ OR [phone]   -can use tylenol or aleve if needed for fevers, aches and pains per instructions  -can use nasal saline a few times per day if nasal congestion  -stay hydrated, drink plenty of fluids and eat small healthy meals - avoid dairy  -follow up with your doctor in 2-3 days unless improving and feeling better and for regular follow up in 2-4 weeks  I hope you are feeling better soon! Seek in-person care or a follow up telemedicine visit promptly if your symptoms worsen, new concerns arise or you are not improving as expected. Call 911 if severe symptoms.

## 2020-01-27 ENCOUNTER — Encounter: Payer: Self-pay | Admitting: Family Medicine

## 2020-01-27 DIAGNOSIS — J45909 Unspecified asthma, uncomplicated: Secondary | ICD-10-CM

## 2020-01-28 NOTE — Telephone Encounter (Signed)
The referral was done  

## 2020-03-26 ENCOUNTER — Telehealth: Payer: Self-pay | Admitting: Family Medicine

## 2020-03-26 DIAGNOSIS — G8929 Other chronic pain: Secondary | ICD-10-CM

## 2020-03-26 NOTE — Telephone Encounter (Signed)
Please advise 

## 2020-03-26 NOTE — Telephone Encounter (Addendum)
Patient is calling and wanted to see if provider could put in a referral to [facility] in [place]  for back pain, please advise. CB is [phone]

## 2020-04-02 ENCOUNTER — Other Ambulatory Visit: Payer: Self-pay | Admitting: Family Medicine

## 2020-04-02 NOTE — Telephone Encounter (Signed)
The referral was done  

## 2020-04-02 NOTE — Telephone Encounter (Signed)
Patient aware of message below and verbally understood some will call to schedule an appointment.

## 2020-04-23 ENCOUNTER — Other Ambulatory Visit: Payer: Self-pay | Admitting: Family Medicine

## 2020-05-06 DIAGNOSIS — Z20822 Contact with and (suspected) exposure to covid-19: Secondary | ICD-10-CM | POA: Diagnosis not present

## 2020-05-06 DIAGNOSIS — J4530 Mild persistent asthma, uncomplicated: Secondary | ICD-10-CM | POA: Diagnosis not present

## 2020-05-06 DIAGNOSIS — B342 Coronavirus infection, unspecified: Secondary | ICD-10-CM | POA: Diagnosis not present

## 2020-05-14 DIAGNOSIS — F17210 Nicotine dependence, cigarettes, uncomplicated: Secondary | ICD-10-CM | POA: Diagnosis not present

## 2020-05-14 DIAGNOSIS — J4541 Moderate persistent asthma with (acute) exacerbation: Secondary | ICD-10-CM | POA: Diagnosis not present

## 2020-05-29 ENCOUNTER — Other Ambulatory Visit: Payer: Self-pay | Admitting: Family Medicine

## 2020-06-20 ENCOUNTER — Encounter: Payer: Self-pay | Admitting: Family Medicine

## 2020-06-20 NOTE — Telephone Encounter (Signed)
We need to evaluate this first. I cannot refer for a thyroid problem when we've never found one. Have her make an in person OV for exam and lab work

## 2020-06-21 NOTE — Telephone Encounter (Signed)
Left detailed message for pt to call the office to schedule an office visit for this problem

## 2020-06-24 NOTE — Telephone Encounter (Signed)
Unfortunately I cannot do this referral because from what she is saying I am no longer her PCP. Once her insurance changes back and I am her PCP again,  then I can address this issue

## 2020-07-08 ENCOUNTER — Other Ambulatory Visit: Payer: Self-pay | Admitting: Family Medicine

## 2020-07-08 NOTE — Telephone Encounter (Signed)
Last refill- 01/08/2020-90 tabs 5 refill Last office visit- 01/08/2020 with Dr. Maudie Mercury  No future visit scheduled

## 2020-08-13 ENCOUNTER — Other Ambulatory Visit: Payer: Self-pay | Admitting: Family Medicine

## 2020-08-13 DIAGNOSIS — K047 Periapical abscess without sinus: Secondary | ICD-10-CM | POA: Diagnosis not present

## 2020-09-01 ENCOUNTER — Other Ambulatory Visit: Payer: Self-pay | Admitting: Family Medicine

## 2020-10-04 IMAGING — DX LUMBAR SPINE - COMPLETE 4+ VIEW
6 series · 6 of 6 positions shown · non-contrast
Comparison: 08/24/2013.

CLINICAL DATA: Back pain.

EXAM:
LUMBAR SPINE - COMPLETE 4+ VIEW

[lumbar spine ap]
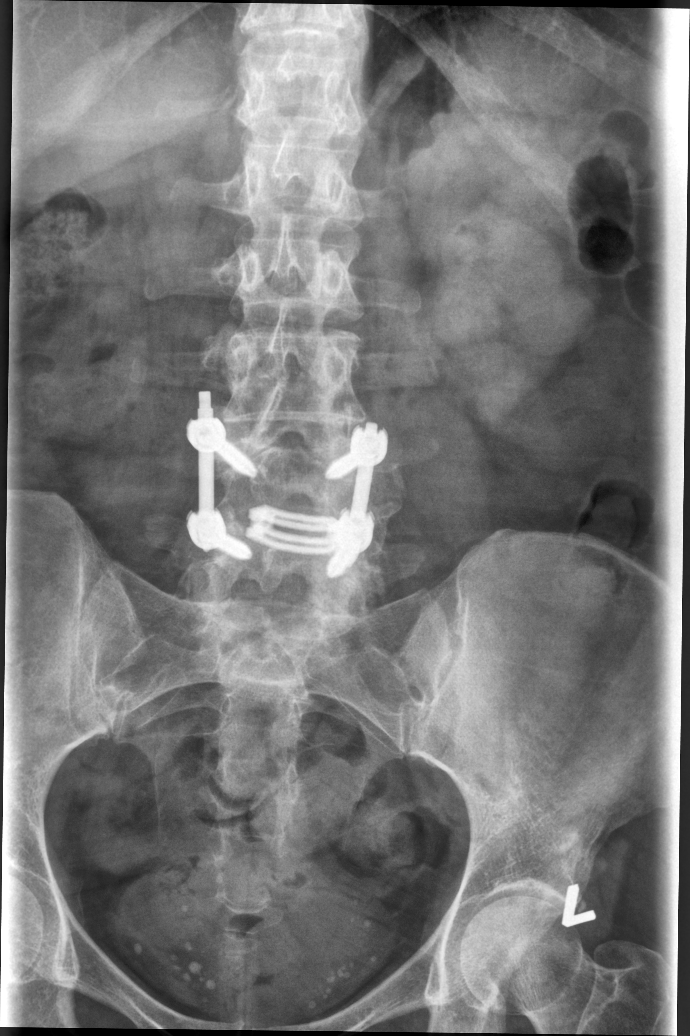

[lumbar spine lat (1 of 2)]
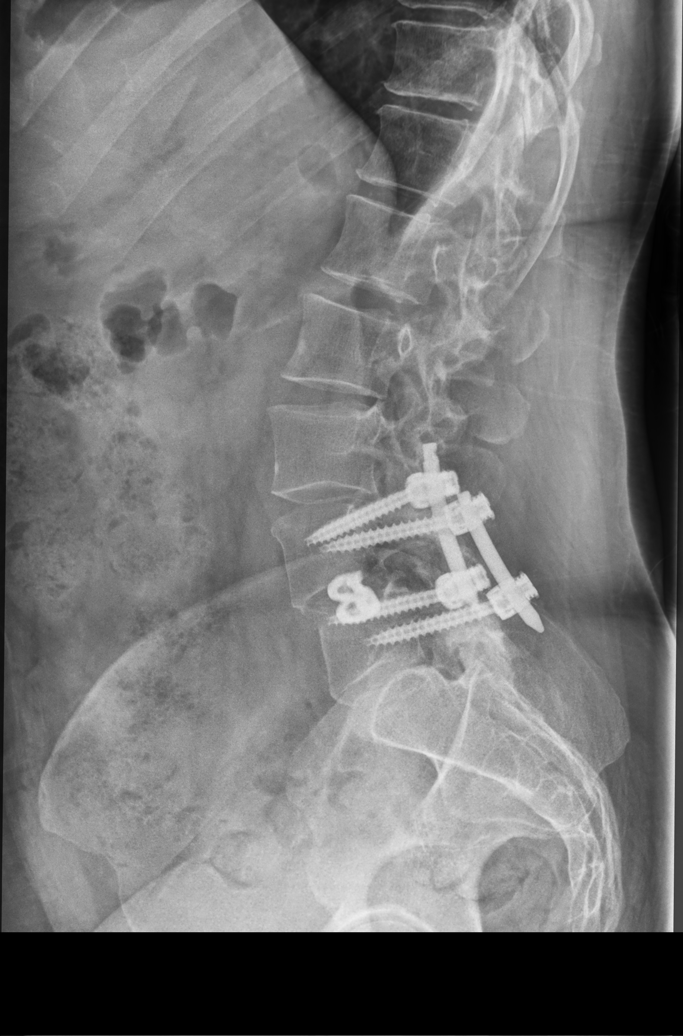

[lumbar spine oblique (1 of 3)]
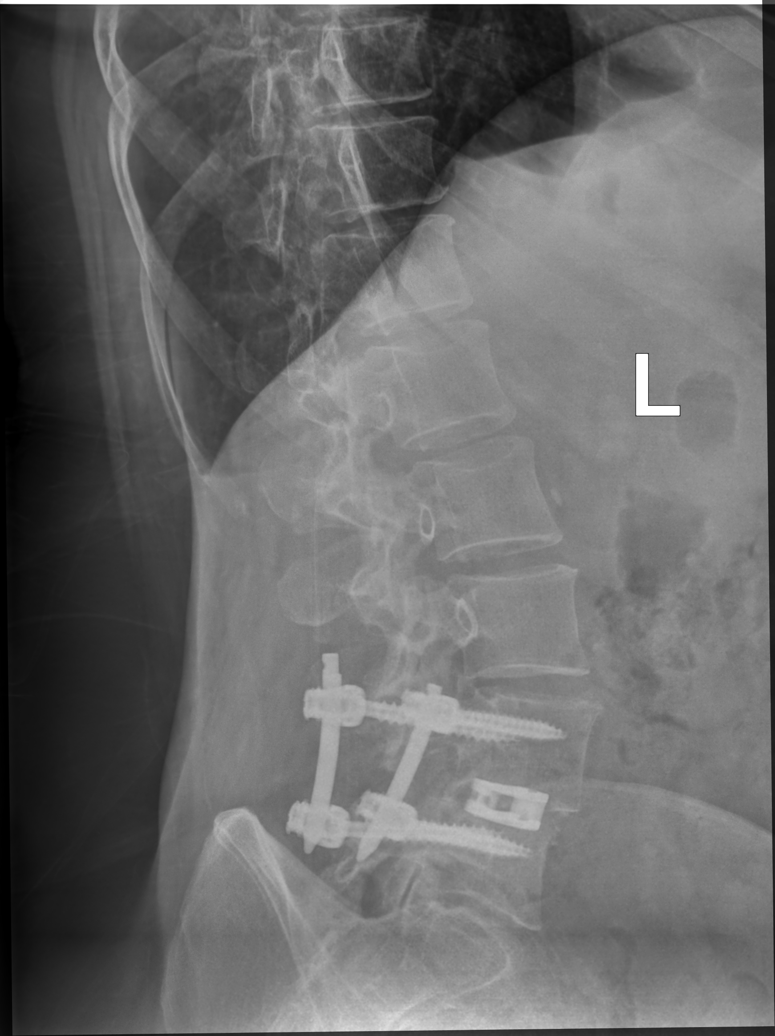

[lumbar spine oblique (2 of 3)]
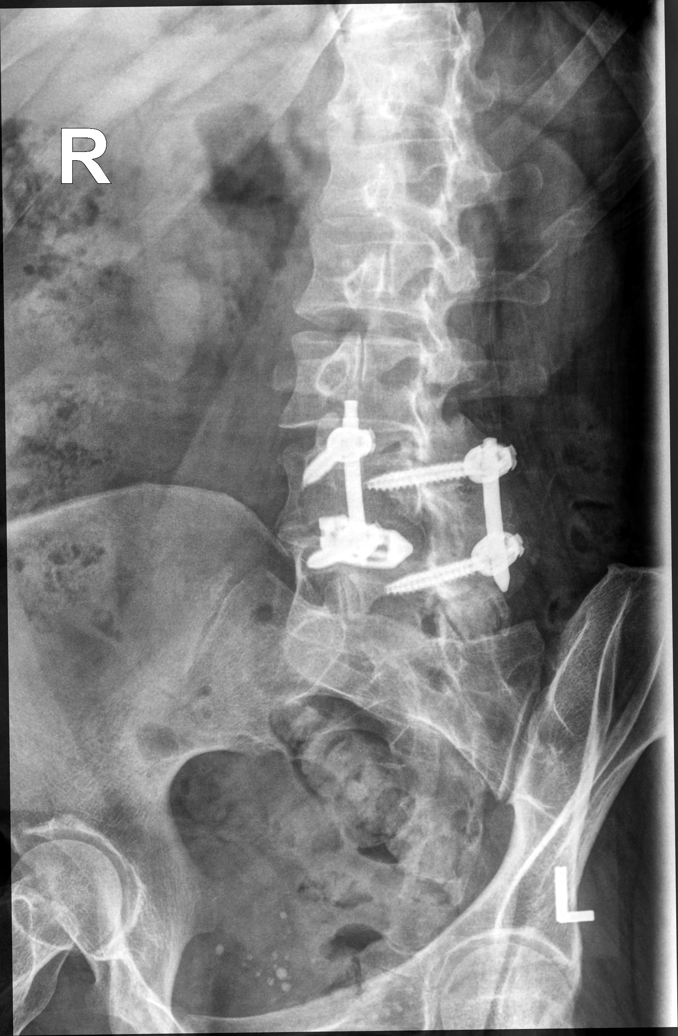

[lumbar spine oblique (3 of 3)]
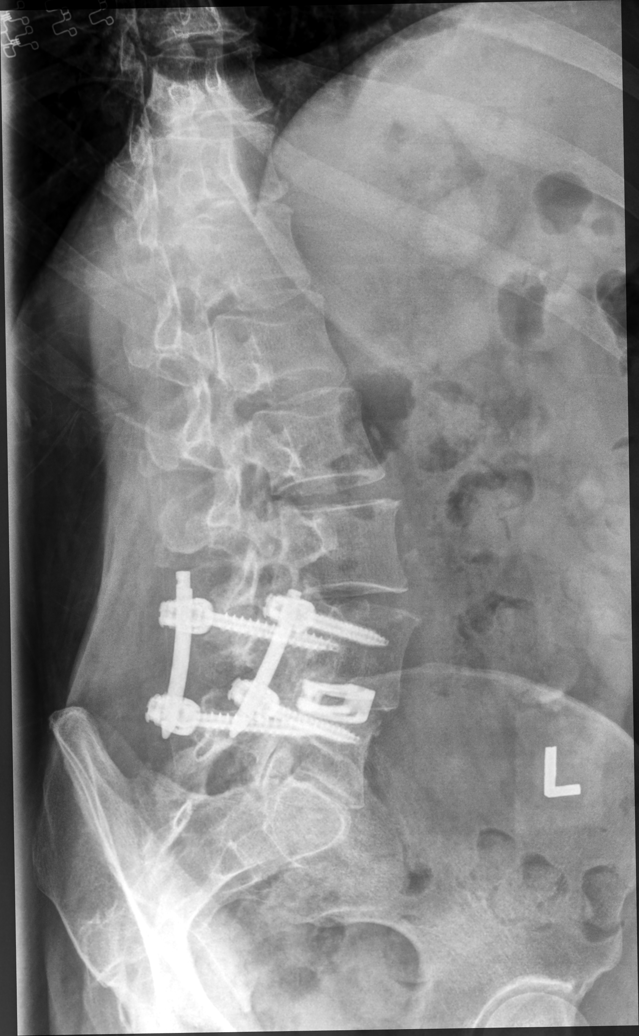

[lumbar spine lat (2 of 2)]
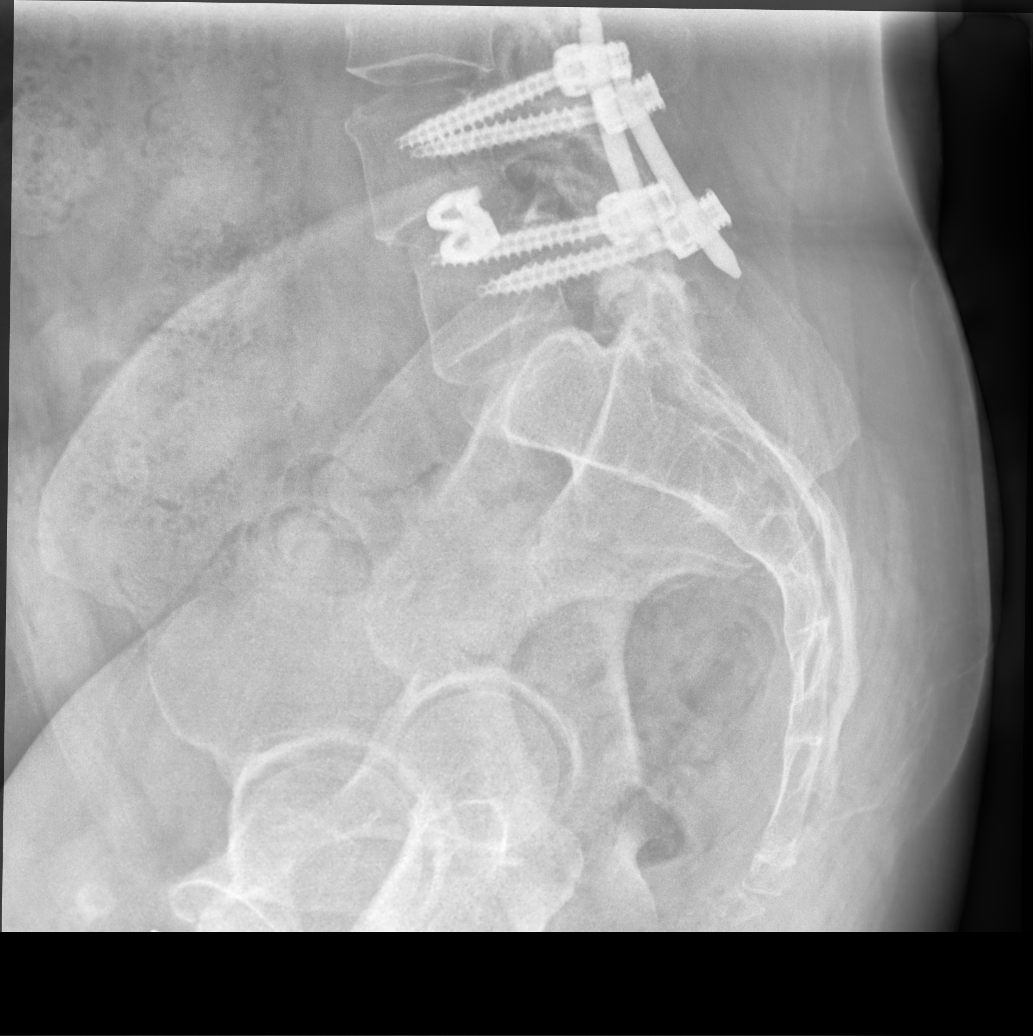

[6 of 6 positions shown; findings below may reference images not displayed]

FINDINGS: Lumbar spine numbered as per prior study. L4-L5 posterior and
interbody fusion. Hardware intact. Unchanged alignment. Stable
appearance from prior exam. Stable mild anterolisthesis L4 on L5.
Diffuse degenerative change. No acute bony abnormality. Pelvic
calcifications consistent phleboliths.
IMPRESSION: Stable L4-L5 posterior and interbody fusion. Stable mild
anterolisthesis L4 on L5. Diffuse degenerative change. No acute bony
abnormality.

## 2020-12-07 ENCOUNTER — Other Ambulatory Visit: Payer: Self-pay | Admitting: Family Medicine

## 2021-01-23 ENCOUNTER — Other Ambulatory Visit: Payer: Self-pay | Admitting: Family Medicine

## 2021-01-23 NOTE — Telephone Encounter (Signed)
Dr. Sarajane Jews patient.  Last refill- 07/09/20--90 tabs, 5 refills Last Video visit- 01/22/2020  Next office visit physical- 02/23/21. Can this patient receive a refill?

## 2021-02-07 ENCOUNTER — Other Ambulatory Visit: Payer: Self-pay | Admitting: Family Medicine

## 2021-02-23 ENCOUNTER — Encounter: Payer: BLUE CROSS/BLUE SHIELD | Admitting: Family Medicine

## 2021-03-11 ENCOUNTER — Other Ambulatory Visit: Payer: Self-pay | Admitting: Family Medicine

## 2021-03-12 NOTE — Telephone Encounter (Signed)
Pt has cpe sch for 04-01-2021 and aware #30 metoprolol has been sent to pharm

## 2021-03-12 NOTE — Telephone Encounter (Signed)
Left a message for pt to call the office and schedule appointment for CPE / medication refill with Dr Sarajane Jews, pt LOV was on 12/2019

## 2021-04-01 ENCOUNTER — Encounter: Payer: Self-pay | Admitting: Family Medicine

## 2021-04-01 ENCOUNTER — Ambulatory Visit (INDEPENDENT_AMBULATORY_CARE_PROVIDER_SITE_OTHER): Payer: Self-pay | Admitting: Family Medicine

## 2021-04-01 VITALS — BP 124/70 | HR 61 | Temp 97.8°F | Ht 66.0 in | Wt 173.4 lb

## 2021-04-01 DIAGNOSIS — Z0000 Encounter for general adult medical examination without abnormal findings: Secondary | ICD-10-CM

## 2021-04-01 LAB — LIPID PANEL
Cholesterol: 184 mg/dL (ref 0–200)
HDL: 43.9 mg/dL (ref >39.00)
LDL Cholesterol: 101 mg/dL — ABNORMAL HIGH (ref 0–99)
NonHDL: 140.09
Total CHOL/HDL Ratio: 4
Triglycerides: 196 mg/dL — ABNORMAL HIGH (ref 0.0–149.0)
VLDL: 39.2 mg/dL (ref 0.0–40.0)

## 2021-04-01 LAB — BASIC METABOLIC PANEL
BUN: 15 mg/dL (ref 6–23)
CO2: 30 mEq/L (ref 19–32)
Calcium: 9.3 mg/dL (ref 8.4–10.5)
Chloride: 101 mEq/L (ref 96–112)
Creatinine, Ser: 0.6 mg/dL (ref 0.40–1.20)
GFR: 103.43 mL/min (ref >60.00)
Glucose, Bld: 91 mg/dL (ref 70–99)
Potassium: 3.4 mEq/L — ABNORMAL LOW (ref 3.5–5.1)
Sodium: 139 mEq/L (ref 135–145)

## 2021-04-01 LAB — CBC WITH DIFFERENTIAL/PLATELET
Basophils Absolute: 0 10*3/uL (ref 0.0–0.1)
Basophils Relative: 0.3 % (ref 0.0–3.0)
Eosinophils Absolute: 0.2 10*3/uL (ref 0.0–0.7)
Eosinophils Relative: 1.4 % (ref 0.0–5.0)
HCT: 43.4 % (ref 36.0–46.0)
Hemoglobin: 14.4 g/dL (ref 12.0–15.0)
Lymphocytes Relative: 38.3 % (ref 12.0–46.0)
Lymphs Abs: 5.6 10*3/uL — ABNORMAL HIGH (ref 0.7–4.0)
MCHC: 33.1 g/dL (ref 30.0–36.0)
MCV: 98.9 fl (ref 78.0–100.0)
Monocytes Absolute: 0.8 10*3/uL (ref 0.1–1.0)
Monocytes Relative: 5.4 % (ref 3.0–12.0)
Neutro Abs: 8 10*3/uL — ABNORMAL HIGH (ref 1.4–7.7)
Neutrophils Relative %: 54.6 % (ref 43.0–77.0)
Platelets: 362 10*3/uL (ref 150.0–400.0)
RBC: 4.39 Mil/uL (ref 3.87–5.11)
RDW: 12.9 % (ref 11.5–15.5)
WBC: 14.7 10*3/uL — ABNORMAL HIGH (ref 4.0–10.5)

## 2021-04-01 LAB — HEPATIC FUNCTION PANEL
ALT: 13 U/L (ref 0–35)
AST: 12 U/L (ref 0–37)
Albumin: 4 g/dL (ref 3.5–5.2)
Alkaline Phosphatase: 58 U/L (ref 39–117)
Bilirubin, Direct: 0 mg/dL (ref 0.0–0.3)
Total Bilirubin: 0.3 mg/dL (ref 0.2–1.2)
Total Protein: 6.9 g/dL (ref 6.0–8.3)

## 2021-04-01 LAB — TSH: TSH: 2.46 u[IU]/mL (ref 0.35–5.50)

## 2021-04-01 LAB — HEMOGLOBIN A1C: Hgb A1c MFr Bld: 6.2 % (ref 4.6–6.5)

## 2021-04-01 MED ORDER — ALBUTEROL SULFATE HFA 108 (90 BASE) MCG/ACT IN AERS: 2.0000 | INHALATION_SPRAY | RESPIRATORY_TRACT | 11 refills | Status: DC | PRN

## 2021-04-01 MED ORDER — HYDROCHLOROTHIAZIDE 25 MG PO TABS: ORAL_TABLET | ORAL | 3 refills | Status: DC

## 2021-04-01 MED ORDER — METHYLPREDNISOLONE 4 MG PO TBPK: ORAL_TABLET | ORAL | 0 refills | Status: DC

## 2021-04-01 MED ORDER — DOXYCYCLINE HYCLATE 100 MG PO CAPS: 100.0000 mg | ORAL_CAPSULE | Freq: Two times a day (BID) | ORAL | 0 refills | Status: AC

## 2021-04-01 MED ORDER — METOPROLOL SUCCINATE ER 100 MG PO TB24: ORAL_TABLET | ORAL | 3 refills | Status: DC

## 2021-04-01 NOTE — Progress Notes (Signed)
Subjective:    Patient ID: [name] [name], female    DOB: 1970/07/25, 51 y.o.   MRN: [id number]  HPI Here for a well exam. She has been doing well until 4 weeks ago when she developed a cough. This produces a little yellow sputum. She gets SOB at times but the albuterol inhaler helps. No chest pain or fever.    Review of Systems  Constitutional: Negative.   HENT: Negative.    Eyes: Negative.   Respiratory:  Positive for cough.   Cardiovascular: Negative.   Gastrointestinal: Negative.   Genitourinary:  Negative for decreased urine volume, difficulty urinating, dyspareunia, dysuria, enuresis, flank pain, frequency, hematuria, pelvic pain and urgency.  Musculoskeletal: Negative.   Skin: Negative.   Neurological: Negative.  Negative for headaches.  Psychiatric/Behavioral: Negative.        Objective:   Physical Exam Constitutional:      General: She is not in acute distress.    Appearance: Normal appearance. She is well-developed.  HENT:     Head: Normocephalic and atraumatic.     Right Ear: External ear normal.     Left Ear: External ear normal.     Nose: Nose normal.     Mouth/Throat:     Pharynx: No oropharyngeal exudate.  Eyes:     General: No scleral icterus.    Conjunctiva/sclera: Conjunctivae normal.     Pupils: Pupils are equal, round, and reactive to light.  Neck:     Thyroid: No thyromegaly.     Vascular: No JVD.  Cardiovascular:     Rate and Rhythm: Normal rate and regular rhythm.     Heart sounds: Normal heart sounds. No murmur heard.   No friction rub. No gallop.  Pulmonary:     Effort: Pulmonary effort is normal. No respiratory distress.     Breath sounds: Normal breath sounds. No wheezing or rales.  Chest:     Chest wall: No tenderness.  Abdominal:     General: Bowel sounds are normal. There is no distension.     Palpations: Abdomen is soft. There is no mass.     Tenderness: There is no abdominal tenderness. There is no guarding or rebound.   Musculoskeletal:        General: No tenderness. Normal range of motion.     Cervical back: Normal range of motion and neck supple.  Lymphadenopathy:     Cervical: No cervical adenopathy.  Skin:    General: Skin is warm and dry.     Findings: No erythema or rash.  Neurological:     Mental Status: She is alert and oriented to person, place, and time.     Cranial Nerves: No cranial nerve deficit.     Motor: No abnormal muscle tone.     Coordination: Coordination normal.     Deep Tendon Reflexes: Reflexes are normal and symmetric. Reflexes normal.  Psychiatric:        Behavior: Behavior normal.        Thought Content: Thought content normal.        Judgment: Judgment normal.          Assessment & Plan:  Well exam. We discussed diet and exercise. Get fasting labs. We discussed the fact that she is overdue for a colonoscopy and a mammogram, but she currently has no medical insurance. She is looking for a new job so we will postpone these a bit longer. She has a bronchitis so we will treat this with Doxycycline and a  Medrol dose pack.  Alysia Penna, MD

## 2021-04-02 ENCOUNTER — Other Ambulatory Visit: Payer: Self-pay

## 2021-04-02 MED ORDER — POTASSIUM CHLORIDE ER 10 MEQ PO TBCR: 10.0000 meq | EXTENDED_RELEASE_TABLET | Freq: Every day | ORAL | 3 refills | Status: DC

## 2021-04-07 ENCOUNTER — Encounter: Payer: Self-pay | Admitting: Family Medicine

## 2021-04-07 NOTE — Telephone Encounter (Signed)
Yes diarrhea is a frequent side effect of Doxycycline. This should be self limited. Please get her a note for work

## 2021-04-07 NOTE — Telephone Encounter (Signed)
See the other message.

## 2021-04-08 ENCOUNTER — Encounter: Payer: Self-pay | Admitting: Family Medicine

## 2021-04-08 NOTE — Telephone Encounter (Signed)
Just cut it to 1/2 a pill a day for now

## 2021-04-28 ENCOUNTER — Other Ambulatory Visit: Payer: Self-pay

## 2021-04-28 DIAGNOSIS — J45909 Unspecified asthma, uncomplicated: Secondary | ICD-10-CM

## 2021-04-28 MED ORDER — FLUTICASONE PROPIONATE HFA 110 MCG/ACT IN AERO: 2.0000 | INHALATION_SPRAY | Freq: Two times a day (BID) | RESPIRATORY_TRACT | 11 refills | Status: DC

## 2021-04-28 NOTE — Telephone Encounter (Signed)
Yes that is a good idea. Call in Flovent HFA 110 mcg to take 2 puffs BID every day, #1 with 11 rf

## 2021-08-07 ENCOUNTER — Other Ambulatory Visit: Payer: Self-pay | Admitting: Family Medicine

## 2021-10-20 ENCOUNTER — Ambulatory Visit (INDEPENDENT_AMBULATORY_CARE_PROVIDER_SITE_OTHER): Payer: Self-pay | Admitting: Family Medicine

## 2021-10-20 VITALS — BP 124/80 | HR 92 | Temp 97.7°F | Wt 172.1 lb

## 2021-10-20 DIAGNOSIS — J029 Acute pharyngitis, unspecified: Secondary | ICD-10-CM

## 2021-10-20 DIAGNOSIS — A491 Streptococcal infection, unspecified site: Secondary | ICD-10-CM

## 2021-10-20 DIAGNOSIS — R059 Cough, unspecified: Secondary | ICD-10-CM

## 2021-10-20 LAB — POC COVID19 BINAXNOW: SARS Coronavirus 2 Ag: NEGATIVE

## 2021-10-20 LAB — POCT RAPID STREP A (OFFICE): Rapid Strep A Screen: NEGATIVE

## 2021-10-20 MED ORDER — CEFUROXIME AXETIL 500 MG PO TABS: 500.0000 mg | ORAL_TABLET | Freq: Two times a day (BID) | ORAL | 0 refills | Status: AC

## 2021-10-20 NOTE — Progress Notes (Signed)
   Subjective:    Patient ID: [name] [name], female    DOB: 1970/06/21, 51 y.o.   MRN: [id number]  HPI Here for 5 days of low grade fevers, headache, PND, ST, and coughing up clear sputum. Some SOB, but this is treated with her inhaler. No NVD. Drinking fluids and taking Ibuprofen.    Review of Systems  Constitutional:  Positive for fever.  HENT:  Positive for congestion, postnasal drip and sore throat. Negative for ear pain and sinus pain.   Eyes: Negative.   Respiratory:  Positive for cough, shortness of breath and wheezing.   Cardiovascular: Negative.        Objective:   Physical Exam Constitutional:      Appearance: Normal appearance. She is not ill-appearing.  HENT:     Right Ear: Tympanic membrane, ear canal and external ear normal.     Left Ear: Tympanic membrane, ear canal and external ear normal.     Nose: Nose normal.     Mouth/Throat:     Pharynx: Oropharynx is clear. No oropharyngeal exudate or posterior oropharyngeal erythema.  Eyes:     Conjunctiva/sclera: Conjunctivae normal.  Neck:     Comments: There are no palpable AC nodes, but she is tender in this area  Pulmonary:     Effort: Pulmonary effort is normal.     Breath sounds: Normal breath sounds.  Neurological:     Mental Status: She is alert.           Assessment & Plan:  This likely as Strep infection. We will treat with 10 days of Ceftin.  [name] Penna, MD

## 2022-01-17 ENCOUNTER — Other Ambulatory Visit: Payer: Self-pay | Admitting: Family Medicine

## 2022-02-05 ENCOUNTER — Other Ambulatory Visit: Payer: Self-pay | Admitting: Family Medicine

## 2022-02-08 NOTE — Telephone Encounter (Addendum)
Last refill-08/07/21--90 tabs, 5 refills Last OV-10/20/21  No future OV scheduled

## 2022-04-07 ENCOUNTER — Encounter: Payer: Self-pay | Admitting: Gastroenterology

## 2022-04-07 ENCOUNTER — Encounter: Payer: Self-pay | Admitting: Family Medicine

## 2022-04-07 ENCOUNTER — Ambulatory Visit: Payer: BC Managed Care – PPO | Admitting: Family Medicine

## 2022-04-07 VITALS — BP 138/92 | HR 83 | Temp 97.6°F | Ht 65.5 in | Wt 176.2 lb

## 2022-04-07 DIAGNOSIS — R7309 Other abnormal glucose: Secondary | ICD-10-CM

## 2022-04-07 DIAGNOSIS — R946 Abnormal results of thyroid function studies: Secondary | ICD-10-CM | POA: Diagnosis not present

## 2022-04-07 DIAGNOSIS — Z0000 Encounter for general adult medical examination without abnormal findings: Secondary | ICD-10-CM

## 2022-04-07 DIAGNOSIS — Z23 Encounter for immunization: Secondary | ICD-10-CM | POA: Diagnosis not present

## 2022-04-07 DIAGNOSIS — E559 Vitamin D deficiency, unspecified: Secondary | ICD-10-CM | POA: Diagnosis not present

## 2022-04-07 DIAGNOSIS — J45909 Unspecified asthma, uncomplicated: Secondary | ICD-10-CM

## 2022-04-07 LAB — CBC WITH DIFFERENTIAL/PLATELET
Basophils Absolute: 0.1 10*3/uL (ref 0.0–0.1)
Basophils Relative: 0.7 % (ref 0.0–3.0)
Eosinophils Absolute: 0.2 10*3/uL (ref 0.0–0.7)
Eosinophils Relative: 2.3 % (ref 0.0–5.0)
HCT: 43.9 % (ref 36.0–46.0)
Hemoglobin: 15.1 g/dL — ABNORMAL HIGH (ref 12.0–15.0)
Lymphocytes Relative: 34.9 % (ref 12.0–46.0)
Lymphs Abs: 3.3 10*3/uL (ref 0.7–4.0)
MCHC: 34.5 g/dL (ref 30.0–36.0)
MCV: 98.4 fl (ref 78.0–100.0)
Monocytes Absolute: 0.8 10*3/uL (ref 0.1–1.0)
Monocytes Relative: 8.9 % (ref 3.0–12.0)
Neutro Abs: 5 10*3/uL (ref 1.4–7.7)
Neutrophils Relative %: 53.2 % (ref 43.0–77.0)
Platelets: 331 10*3/uL (ref 150.0–400.0)
RBC: 4.46 Mil/uL (ref 3.87–5.11)
RDW: 12.8 % (ref 11.5–15.5)
WBC: 9.4 10*3/uL (ref 4.0–10.5)

## 2022-04-07 LAB — LIPID PANEL
Cholesterol: 187 mg/dL (ref 0–200)
HDL: 47.2 mg/dL (ref >39.00)
LDL Cholesterol: 103 mg/dL — ABNORMAL HIGH (ref 0–99)
NonHDL: 140.23
Total CHOL/HDL Ratio: 4
Triglycerides: 185 mg/dL — ABNORMAL HIGH (ref 0.0–149.0)
VLDL: 37 mg/dL (ref 0.0–40.0)

## 2022-04-07 LAB — BASIC METABOLIC PANEL
BUN: 9 mg/dL (ref 6–23)
CO2: 31 mEq/L (ref 19–32)
Calcium: 9.4 mg/dL (ref 8.4–10.5)
Chloride: 98 mEq/L (ref 96–112)
Creatinine, Ser: 0.64 mg/dL (ref 0.40–1.20)
GFR: 101.11 mL/min (ref >60.00)
Glucose, Bld: 103 mg/dL — ABNORMAL HIGH (ref 70–99)
Potassium: 3.6 mEq/L (ref 3.5–5.1)
Sodium: 141 mEq/L (ref 135–145)

## 2022-04-07 LAB — T3, FREE: T3, Free: 4.2 pg/mL (ref 2.3–4.2)

## 2022-04-07 LAB — TSH: TSH: 0.9 u[IU]/mL (ref 0.35–5.50)

## 2022-04-07 LAB — HEPATIC FUNCTION PANEL
ALT: 30 U/L (ref 0–35)
AST: 29 U/L (ref 0–37)
Albumin: 4.4 g/dL (ref 3.5–5.2)
Alkaline Phosphatase: 69 U/L (ref 39–117)
Bilirubin, Direct: 0.1 mg/dL (ref 0.0–0.3)
Total Bilirubin: 0.4 mg/dL (ref 0.2–1.2)
Total Protein: 7.4 g/dL (ref 6.0–8.3)

## 2022-04-07 LAB — VITAMIN D 25 HYDROXY (VIT D DEFICIENCY, FRACTURES): VITD: 36.92 ng/mL (ref 30.00–100.00)

## 2022-04-07 LAB — T4, FREE: Free T4: 0.89 ng/dL (ref 0.60–1.60)

## 2022-04-07 LAB — HEMOGLOBIN A1C: Hgb A1c MFr Bld: 6.3 % (ref 4.6–6.5)

## 2022-04-07 MED ORDER — ALBUTEROL SULFATE (2.5 MG/3ML) 0.083% IN NEBU: 2.5000 mg | INHALATION_SOLUTION | RESPIRATORY_TRACT | 12 refills | Status: DC | PRN

## 2022-04-07 MED ORDER — CYCLOBENZAPRINE HCL 5 MG PO TABS: 5.0000 mg | ORAL_TABLET | Freq: Three times a day (TID) | ORAL | 5 refills | Status: DC | PRN

## 2022-04-07 MED ORDER — POTASSIUM CHLORIDE ER 10 MEQ PO TBCR: 10.0000 meq | EXTENDED_RELEASE_TABLET | Freq: Every day | ORAL | 11 refills | Status: DC

## 2022-04-07 MED ORDER — FLUTICASONE PROPIONATE HFA 110 MCG/ACT IN AERO: 2.0000 | INHALATION_SPRAY | Freq: Two times a day (BID) | RESPIRATORY_TRACT | 11 refills | Status: DC

## 2022-04-07 MED ORDER — ALBUTEROL SULFATE HFA 108 (90 BASE) MCG/ACT IN AERS: 2.0000 | INHALATION_SPRAY | RESPIRATORY_TRACT | 11 refills | Status: DC | PRN

## 2022-04-07 MED ORDER — METOPROLOL SUCCINATE ER 100 MG PO TB24: 100.0000 mg | ORAL_TABLET | Freq: Two times a day (BID) | ORAL | 3 refills | Status: DC

## 2022-04-07 NOTE — Progress Notes (Signed)
Subjective:    Patient ID: [name] [name], female    DOB: [birth year], 52 y.o.   MRN: [id number]  HPI Here for a well exam. She feels well in general. 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 Her BP has been running a little high at home (150's over 90's) with pulses around 80-85. Her asthma is well controlled most of the time, but a few weeks ago she had an attack that did not respond to her Albuterol HFA. She called EMS and they gave her several nebulizer treatments that were successful. She asks if ahe can get a nebulizer herself.     Review of Systems  Constitutional: Negative.   HENT: Negative.    Eyes: Negative.   Respiratory: Negative.    Cardiovascular: Negative.   Gastrointestinal: Negative.   Genitourinary:  Negative for decreased urine volume, difficulty urinating, dyspareunia, dysuria, enuresis, flank pain, frequency, hematuria, pelvic pain and urgency.  Musculoskeletal: Negative.   Skin: Negative.   Neurological: Negative.  Negative for headaches.  Psychiatric/Behavioral: Negative.         Objective:   Physical Exam Constitutional:      General: She is not in acute distress.    Appearance: Normal appearance. She is well-developed.  HENT:     Head: Normocephalic and atraumatic.     Right Ear: External ear normal.     Left Ear: External ear normal.     Nose: Nose normal.     Mouth/Throat:     Pharynx: No oropharyngeal exudate.  Eyes:     General: No scleral icterus.    Conjunctiva/sclera: Conjunctivae normal.     Pupils: Pupils are equal, round, and reactive to light.  Neck:     Thyroid: No thyromegaly.     Vascular: No JVD.  Cardiovascular:     Rate and Rhythm: Normal rate and regular rhythm.     Heart sounds: Normal heart sounds. No murmur heard.    No friction rub. No gallop.  Pulmonary:     Effort: Pulmonary effort is normal. No respiratory distress.     Breath sounds: Normal breath  sounds. No wheezing or rales.  Chest:     Chest wall: No tenderness.  Abdominal:     General: Bowel sounds are normal. There is no distension.     Palpations: Abdomen is soft. There is no mass.     Tenderness: There is no abdominal tenderness. There is no guarding or rebound.  Musculoskeletal:        General: No tenderness. Normal range of motion.     Cervical back: Normal range of motion and neck supple.  Lymphadenopathy:     Cervical: No cervical adenopathy.  Skin:    General: Skin is warm and dry.     Findings: No erythema or rash.  Neurological:     Mental Status: She is alert and oriented to person, place, and time.     Cranial Nerves: No cranial nerve deficit.     Motor: No abnormal muscle tone.     Coordination: Coordination normal.     Deep Tendon Reflexes: Reflexes are normal and symmetric. Reflexes normal.  Psychiatric:        Behavior: Behavior normal.        Thought Content: Thought content normal.        Judgment: Judgment normal.           Assessment & Plan:  Well exam.  We discussed diet and exercise. Get fasting labs. Set up a colonoscopy. For the HTN, we will increase the Metoprolol succinate to 100 mg BID. She will monitor the BP and report back to [place] in 2 weeks. For the asthma. We will order her a nebulizer with albuterol for her to use at home as needed. Alysia Penna, MD

## 2022-04-07 NOTE — Addendum Note (Signed)
Addended by: Wyvonne Lenz on: 04/07/2022 10:04 AM   Modules accepted: Orders

## 2022-04-13 ENCOUNTER — Telehealth: Payer: Self-pay | Admitting: Family Medicine

## 2022-04-13 ENCOUNTER — Encounter: Payer: Self-pay | Admitting: Family Medicine

## 2022-04-13 MED ORDER — FLUTICASONE-SALMETEROL 115-21 MCG/ACT IN AERO: 2.0000 | INHALATION_SPRAY | Freq: Two times a day (BID) | RESPIRATORY_TRACT | 12 refills | Status: DC

## 2022-04-13 NOTE — Telephone Encounter (Signed)
I cancelled the Flonase, and instead I will let her try Advair HFA every day. I also wrote a RX for her to get the RSV vaccine

## 2022-04-13 NOTE — Telephone Encounter (Signed)
Pt said [facility] will not administer the RSV vaccine due to she is not pregnant nor 64 older per [place] guideline. Pt has asthma and  dermatomyositis. Please advise

## 2022-04-14 NOTE — Telephone Encounter (Signed)
Pt prescription for RSV was faxed to pt pharmacy, pt notified

## 2022-04-15 NOTE — Telephone Encounter (Signed)
Spoke with pt aware of Dr Sarajane Jews recommendations, verbalized understanding

## 2022-04-22 ENCOUNTER — Other Ambulatory Visit (HOSPITAL_COMMUNITY): Payer: Self-pay

## 2022-04-23 ENCOUNTER — Other Ambulatory Visit (HOSPITAL_COMMUNITY): Payer: Self-pay

## 2022-05-11 ENCOUNTER — Ambulatory Visit: Payer: BC Managed Care – PPO | Admitting: Family Medicine

## 2022-05-11 ENCOUNTER — Encounter: Payer: Self-pay | Admitting: Family Medicine

## 2022-05-11 VITALS — BP 120/80 | HR 78 | Temp 98.0°F | Wt 178.8 lb

## 2022-05-11 DIAGNOSIS — R252 Cramp and spasm: Secondary | ICD-10-CM

## 2022-05-11 NOTE — Progress Notes (Signed)
   Subjective:    Patient ID: [name] [name], female    DOB: [birth year], 52 y.o.   MRN: [id number]  HPI Here for 3 weeks of fatigue and diffuse muscle cramps. The worst cramps involve the left arm, the right hand, and the right lower leg. No fever or other symptoms. She has never had this before. About the time these symptoms began she received both the singles vaccine and the Covid vaccine on the same day. She is convinced the cramps are a side effect of the vaccines.    Review of Systems  Constitutional:  Positive for fatigue.  Respiratory: Negative.    Cardiovascular: Negative.        Objective:   Physical Exam Constitutional:      Appearance: Normal appearance. She is not ill-appearing.  Cardiovascular:     Rate and Rhythm: Normal rate and regular rhythm.     Pulses: Normal pulses.     Heart sounds: Normal heart sounds.  Pulmonary:     Effort: Pulmonary effort is normal.     Breath sounds: Normal breath sounds.  Musculoskeletal:        General: No swelling or tenderness.  Neurological:     General: No focal deficit present.     Mental Status: She is alert and oriented to person, place, and time.           Assessment & Plan:  Muscle aches of uncertain etiology. She will take magnesium 400 mg tablets 2 or 3 a day as needed. Recheck as needed.  Alysia Penna, MD

## 2022-05-12 DIAGNOSIS — Z1231 Encounter for screening mammogram for malignant neoplasm of breast: Secondary | ICD-10-CM | POA: Diagnosis not present

## 2022-05-12 DIAGNOSIS — Z124 Encounter for screening for malignant neoplasm of cervix: Secondary | ICD-10-CM | POA: Diagnosis not present

## 2022-05-12 DIAGNOSIS — Z01419 Encounter for gynecological examination (general) (routine) without abnormal findings: Secondary | ICD-10-CM | POA: Diagnosis not present

## 2022-05-17 ENCOUNTER — Ambulatory Visit (AMBULATORY_SURGERY_CENTER): Payer: BC Managed Care – PPO

## 2022-05-17 VITALS — Ht 66.0 in | Wt 178.0 lb

## 2022-05-17 DIAGNOSIS — M3390 Dermatopolymyositis, unspecified, organ involvement unspecified: Secondary | ICD-10-CM | POA: Diagnosis not present

## 2022-05-17 DIAGNOSIS — Z1211 Encounter for screening for malignant neoplasm of colon: Secondary | ICD-10-CM

## 2022-05-17 DIAGNOSIS — L573 Poikiloderma of Civatte: Secondary | ICD-10-CM | POA: Diagnosis not present

## 2022-05-17 DIAGNOSIS — I781 Nevus, non-neoplastic: Secondary | ICD-10-CM | POA: Diagnosis not present

## 2022-05-17 MED ORDER — NA SULFATE-K SULFATE-MG SULF 17.5-3.13-1.6 GM/177ML PO SOLN: 1.0000 | Freq: Once | ORAL | 0 refills | Status: AC

## 2022-05-17 NOTE — Progress Notes (Signed)
No egg or soy allergy known to patient  No issues known to pt with past sedation with any surgeries or procedures Patient denies ever being told they had issues or difficulty with intubation  No FH of Malignant Hyperthermia Pt is not on diet pills Pt is not on  home 02  Pt is not on blood thinners  Pt denies issues with constipation  No A fib or A flutter Have any cardiac testing pending--no Pt instructed to use [email or web address] or GoodRx for a price reduction on prep   

## 2022-05-26 ENCOUNTER — Encounter: Payer: Self-pay | Admitting: Gastroenterology

## 2022-06-04 ENCOUNTER — Encounter: Payer: Self-pay | Admitting: Family Medicine

## 2022-06-10 DIAGNOSIS — J209 Acute bronchitis, unspecified: Secondary | ICD-10-CM | POA: Diagnosis not present

## 2022-06-13 ENCOUNTER — Encounter: Payer: Self-pay | Admitting: Certified Registered Nurse Anesthetist

## 2022-06-14 ENCOUNTER — Encounter: Payer: Self-pay | Admitting: Gastroenterology

## 2022-06-14 ENCOUNTER — Ambulatory Visit (AMBULATORY_SURGERY_CENTER): Payer: BC Managed Care – PPO | Admitting: Gastroenterology

## 2022-06-14 VITALS — BP 139/77 | HR 76 | Temp 97.5°F | Resp 17 | Ht 66.0 in | Wt 178.0 lb

## 2022-06-14 DIAGNOSIS — D128 Benign neoplasm of rectum: Secondary | ICD-10-CM | POA: Diagnosis not present

## 2022-06-14 DIAGNOSIS — D125 Benign neoplasm of sigmoid colon: Secondary | ICD-10-CM

## 2022-06-14 DIAGNOSIS — K635 Polyp of colon: Secondary | ICD-10-CM | POA: Diagnosis not present

## 2022-06-14 DIAGNOSIS — D122 Benign neoplasm of ascending colon: Secondary | ICD-10-CM

## 2022-06-14 DIAGNOSIS — K621 Rectal polyp: Secondary | ICD-10-CM | POA: Diagnosis not present

## 2022-06-14 DIAGNOSIS — Z1211 Encounter for screening for malignant neoplasm of colon: Secondary | ICD-10-CM | POA: Diagnosis not present

## 2022-06-14 DIAGNOSIS — K5730 Diverticulosis of large intestine without perforation or abscess without bleeding: Secondary | ICD-10-CM

## 2022-06-14 MED ORDER — SODIUM CHLORIDE 0.9 % IV SOLN: 500.0000 mL | Freq: Once | INTRAVENOUS | Status: DC

## 2022-06-14 NOTE — Progress Notes (Signed)
Pt's states no medical or surgical changes since previsit or office visit. 

## 2022-06-14 NOTE — Progress Notes (Signed)
Report given to PACU, vss 

## 2022-06-14 NOTE — Addendum Note (Signed)
Addended by: Samantha Crimes on: 06/14/2022 04:58 PM   Modules accepted: Orders

## 2022-06-14 NOTE — Progress Notes (Signed)
GASTROENTEROLOGY PROCEDURE H&P NOTE   Primary Care Physician: Laurey Morale, MD    Reason for Procedure:  Colon Cancer screening  Plan:    Colonoscopy  Patient is appropriate for endoscopic procedure(s) in the ambulatory ([facility]) setting.  The nature of the procedure, as well as the risks, benefits, and alternatives were carefully and thoroughly reviewed with the patient. Ample time for discussion and questions allowed. The patient understood, was satisfied, and agreed to proceed.     HPI: [name] [name] is a 52 y.o. female who presents for colonoscopy for routine Colon Cancer screening.  No active GI symptoms.  No known family history of colon cancer or related malignancy.  Patient is otherwise without complaints or active issues today.  Past Medical History:  Diagnosis Date   ALLERGIC RHINITIS 06/26/2007   ANXIETY 08/22/2007   ASTHMA 06/26/2007   last attack 1 mo ago   ASTHMA, WITH ACUTE EXACERBATION 07/26/2007   DEPRESSION 08/22/2007   DM (dermatomyositis)    sees Dr. Jerene Pitch (Rheum at [facility]) and Dr. Saundra Shelling (Derm at [facility])   Fibromyalgia    GERD (gastroesophageal reflux disease)    HYPERGLYCEMIA 08/22/2007   Hypertension    MYALGIA 04/15/2010   NICOTINE ADDICTION 06/26/2007   NONSPECIFIC ABNORM RESULTS THYROID FUNCT STUDY 08/22/2007   Sinusitis    Spontaneous pneumothorax     Past Surgical History:  Procedure Laterality Date   CERVICAL CONIZATION W/BX     fallopian tubectomy Right 2005   KNEE SURGERY     LUMBAR FUSION  08/23/2013   L4 L5   DR BROOKS   LUNG SURGERY      Prior to Admission medications   Medication Sig Start Date End Date Taking? Authorizing Provider  albuterol (PROVENTIL) (2.5 MG/3ML) 0.083% nebulizer solution Take 3 mLs (2.5 mg total) by nebulization every 4 (four) hours as needed for wheezing or shortness of breath. 04/07/22   Laurey Morale, MD  albuterol (VENTOLIN HFA) 108 (90 Base) MCG/ACT inhaler Inhale 2 puffs into the lungs every 4  (four) hours as needed for wheezing or shortness of breath. 04/07/22   Laurey Morale, MD  Calcium Carb-Cholecalciferol (CALCIUM 600 + D PO) Take by mouth. ( vitamin d 800 mg )    [provider]  cyanocobalamin 1000 MCG tablet Take 1,000 mcg by mouth daily.    [provider]  cyclobenzaprine (FLEXERIL) 5 MG tablet Take 1 tablet (5 mg total) by mouth 3 (three) times daily as needed for muscle spasms. 04/07/22   Laurey Morale, MD  famotidine (PEPCID) 20 MG tablet Take 20 mg by mouth 2 (two) times daily.    [provider]  Ferrous Sulfate (IRON PO) Take by mouth daily. OTC Nature Made    [provider]  fluticasone-salmeterol (ADVAIR HFA) 115-21 MCG/ACT inhaler Inhale 2 puffs into the lungs 2 (two) times daily. 04/13/22   Laurey Morale, MD  hydrochlorothiazide (HYDRODIURIL) 25 MG tablet TAKE 1 TABLET(25 MG) BY MOUTH DAILY 01/19/22   Laurey Morale, MD  ibuprofen (ADVIL) 800 MG tablet Take 1 tablet by mouth 3 (three) times daily.    [provider]  loratadine (CLARITIN) 10 MG tablet Take 10 mg by mouth daily.    [provider]  LORazepam (ATIVAN) 0.5 MG tablet TAKE 1 TABLET(0.5 MG) BY MOUTH THREE TIMES DAILY AS NEEDED FOR ANXIETY 02/08/22   Laurey Morale, MD  Magnesium 250 MG TABS Take 250 mg by mouth daily at 12 noon.  [provider]  metoprolol succinate (TOPROL-XL) 100 MG 24 hr tablet Take 1 tablet (100 mg total) by mouth in the morning and at bedtime. TAKE 1 TABLET(100 MG) BY MOUTH DAILY 04/07/22   Laurey Morale, MD  Multiple Vitamin (MULTIVITAMIN) tablet Take 1 tablet by mouth daily.    [provider]  potassium chloride (KLOR-CON 10) 10 MEQ tablet Take 1 tablet (10 mEq total) by mouth daily. 04/07/22   Laurey Morale, MD  VITAMIN D, CHOLECALCIFEROL, PO Take 2,000 mg by mouth 2 (two) times daily.     [provider]    Current Outpatient Medications  Medication Sig Dispense Refill   albuterol (PROVENTIL)  (2.5 MG/3ML) 0.083% nebulizer solution Take 3 mLs (2.5 mg total) by nebulization every 4 (four) hours as needed for wheezing or shortness of breath. 75 mL 12   albuterol (VENTOLIN HFA) 108 (90 Base) MCG/ACT inhaler Inhale 2 puffs into the lungs every 4 (four) hours as needed for wheezing or shortness of breath. 18 g 11   Calcium Carb-Cholecalciferol (CALCIUM 600 + D PO) Take by mouth. ( vitamin d 800 mg )     cyanocobalamin 1000 MCG tablet Take 1,000 mcg by mouth daily.     cyclobenzaprine (FLEXERIL) 5 MG tablet Take 1 tablet (5 mg total) by mouth 3 (three) times daily as needed for muscle spasms. 90 tablet 5   famotidine (PEPCID) 20 MG tablet Take 20 mg by mouth 2 (two) times daily.     Ferrous Sulfate (IRON PO) Take by mouth daily. OTC Nature Made     fluticasone-salmeterol (ADVAIR HFA) 115-21 MCG/ACT inhaler Inhale 2 puffs into the lungs 2 (two) times daily. 1 each 12   hydrochlorothiazide (HYDRODIURIL) 25 MG tablet TAKE 1 TABLET(25 MG) BY MOUTH DAILY 90 tablet 3   ibuprofen (ADVIL) 800 MG tablet Take 1 tablet by mouth 3 (three) times daily.     loratadine (CLARITIN) 10 MG tablet Take 10 mg by mouth daily.     LORazepam (ATIVAN) 0.5 MG tablet TAKE 1 TABLET(0.5 MG) BY MOUTH THREE TIMES DAILY AS NEEDED FOR ANXIETY 90 tablet 5   Magnesium 250 MG TABS Take 250 mg by mouth daily at 12 noon.     metoprolol succinate (TOPROL-XL) 100 MG 24 hr tablet Take 1 tablet (100 mg total) by mouth in the morning and at bedtime. TAKE 1 TABLET(100 MG) BY MOUTH DAILY 180 tablet 3   Multiple Vitamin (MULTIVITAMIN) tablet Take 1 tablet by mouth daily.     potassium chloride (KLOR-CON 10) 10 MEQ tablet Take 1 tablet (10 mEq total) by mouth daily. 30 tablet 11   VITAMIN D, CHOLECALCIFEROL, PO Take 2,000 mg by mouth 2 (two) times daily.      No current facility-administered medications for this visit.    Allergies as of 06/14/2022 - Review Complete 06/13/2022  Allergen Reaction Noted   Codeine Hives and Nausea And  Vomiting 06/26/2007    Family History  Problem Relation Age of Onset   Colon polyps Mother    Colon polyps Maternal Grandmother    Colon cancer Neg Hx    Esophageal cancer Neg Hx    Rectal cancer Neg Hx    Stomach cancer Neg Hx     Social History   Socioeconomic History   Marital status: Divorced    Spouse name: Not on file   Number of children: Not on file   Years of education: Not on file   Highest education level: Associate degree:  occupational, Hotel manager, or vocational program  Occupational History   Not on file  Tobacco Use   Smoking status: Every Day    Years: 27    Types: Cigarettes    Last attempt to quit: 08/16/2013    Years since quitting: 8.8   Smokeless tobacco: Never   Tobacco comments:    less than a pack per day  Substance and Sexual Activity   Alcohol use: Yes    Alcohol/week: 0.0 standard drinks of alcohol    Comment: occ   Drug use: Yes    Types: Marijuana   Sexual activity: Not on file  Other Topics Concern   Not on file  Social History Narrative   Not on file   Social Determinants of Health   Financial Resource Strain: Medium Risk (10/20/2021)   Overall Financial Resource Strain (CARDIA)    Difficulty of Paying Living Expenses: Somewhat hard  Food Insecurity: Food Insecurity Present (10/20/2021)   Hunger Vital Sign    Worried About Running Out of Food in the Last Year: Sometimes true    Ran Out of Food in the Last Year: Sometimes true  Transportation Needs: No Transportation Needs (10/20/2021)   PRAPARE - Hydrologist (Medical): No    Lack of Transportation (Non-Medical): No  Physical Activity: Insufficiently Active (10/20/2021)   Exercise Vital Sign    Days of Exercise per Week: 2 days    Minutes of Exercise per Session: 10 min  Stress: Stress Concern Present (10/20/2021)   [place]    Feeling of Stress : To some extent  Social Connections:  Socially Isolated (10/20/2021)   Social Connection and Isolation Panel [NHANES]    Frequency of Communication with Friends and Family: Once a week    Frequency of Social Gatherings with Friends and Family: Never    Attends Religious Services: Never    Marine scientist or Organizations: No    Attends Music therapist: Not on file    Marital Status: Divorced  Intimate Partner Violence: Not on file    Physical Exam: Vital signs in last 24 hours: @There  were no vitals taken for this visit. GEN: NAD EYE: Sclerae anicteric ENT: MMM CV: Non-tachycardic Pulm: CTA b/l GI: Soft, NT/ND NEURO:  Alert & Oriented x 3   Gerrit Heck, DO [facility]   06/14/2022 10:56 AM

## 2022-06-14 NOTE — Op Note (Signed)
[facility] Patient Name: [name] [name] Procedure Date: 06/14/2022 11:30 AM MRN: [id number] Endoscopist: Gerrit Heck , MD, [id number] Age: 52 Referring MD:  Date of Birth: [birth year] Gender: Female Account #: [id number] Procedure:                Colonoscopy Indications:              Screening for colorectal malignant neoplasm Medicines:                Monitored Anesthesia Care Procedure:                Pre-Anesthesia Assessment:                           - Prior to the procedure, a History and Physical                            was performed, and patient medications and                            allergies were reviewed. The patient's tolerance of                            previous anesthesia was also reviewed. The risks                            and benefits of the procedure and the sedation                            options and risks were discussed with the patient.                            All questions were answered, and informed consent                            was obtained. Prior Anticoagulants: The patient has                            taken no anticoagulant or antiplatelet agents. ASA                            Grade Assessment: II - A patient with mild systemic                            disease. After reviewing the risks and benefits,                            the patient was deemed in satisfactory condition to                            undergo the procedure.                           After obtaining informed consent, the colonoscope  was passed under direct vision. Throughout the                            procedure, the patient's blood pressure, pulse, and                            oxygen saturations were monitored continuously. The                            Olympus CF-HQ190L [id number]) Colonoscope was                            introduced through the anus and advanced to the the                            cecum,  identified by appendiceal orifice and                            ileocecal valve. The colonoscopy was performed                            without difficulty. The patient tolerated the                            procedure well. The quality of the bowel                            preparation was good. The ileocecal valve,                            appendiceal orifice, and rectum were photographed. Scope In: 11:34:52 AM Scope Out: 11:50:42 AM Scope Withdrawal Time: 0 hours 12 minutes 16 seconds  Total Procedure Duration: 0 hours 15 minutes 50 seconds  Findings:                 The perianal and digital rectal examinations were                            normal.                           A 5 mm polyp was found in the ascending colon. The                            polyp was sessile. The polyp was removed with a                            cold snare. Resection and retrieval were complete.                            Estimated blood loss was minimal.                           Two sessile polyps were found in the sigmoid colon.  The polyps were 3 to 6 mm in size. These polyps                            were removed with a cold snare. Resection and                            retrieval were complete. Estimated blood loss was                            minimal.                           A 6 mm polyp was found in the distal sigmoid colon.                            The polyp was sessile. The polyp was removed with a                            cold snare. Resection and retrieval were complete.                            Estimated blood loss was minimal.                           Two sessile polyps were found in the rectum. The                            polyps were 2 to 3 mm in size. These polyps were                            removed with a cold snare. Resection and retrieval                            were complete. Estimated blood loss was minimal.                            A few small-mouthed diverticula were found in the                            sigmoid colon.                           The retroflexed view of the distal rectum and anal                            verge was normal and showed no anal or rectal                            abnormalities. Complications:            No immediate complications. Estimated Blood Loss:     Estimated blood loss was minimal. Impression:               - One 5 mm polyp  in the ascending colon, removed                            with a cold snare. Resected and retrieved.                           - Two 3 to 6 mm polyps in the sigmoid colon,                            removed with a cold snare. Resected and retrieved.                           - One 6 mm polyp in the distal sigmoid colon,                            removed with a cold snare. Resected and retrieved.                           - Two 2 to 3 mm polyps in the rectum, removed with                            a cold snare. Resected and retrieved.                           - Diverticulosis in the sigmoid colon.                           - The distal rectum and anal verge are normal on                            retroflexion view.                           - The GI Genius (intelligent endoscopy module),                            computer-aided polyp detection system powered by AI                            was utilized to detect colorectal polyps through                            enhanced visualization during colonoscopy. Recommendation:           - Patient has a contact number available for                            emergencies. The signs and symptoms of potential                            delayed complications were discussed with the                            patient. Return to normal activities  tomorrow.                            Written discharge instructions were provided to the                            patient.                           - Resume  previous diet.                           - Continue present medications.                           - Await pathology results.                           - Repeat colonoscopy for surveillance based on                            pathology results.                           - Return to GI office PRN. Gerrit Heck, MD 06/14/2022 11:55:39 AM

## 2022-06-14 NOTE — Patient Instructions (Signed)
Handouts on polyps and diverticulosis given to patient. Await pathology results. Resume previous diet and continue present medications. Repeat colonoscopy for surveillance will be determined based off of pathology results. Return to GI office as needed.  YOU HAD AN ENDOSCOPIC PROCEDURE TODAY AT [facility]:   Refer to the procedure report that was given to you for any specific questions about what was found during the examination.  If the procedure report does not answer your questions, please call your gastroenterologist to clarify.  If you requested that your care partner not be given the details of your procedure findings, then the procedure report has been included in a sealed envelope for you to review at your convenience later.  YOU SHOULD EXPECT: Some feelings of bloating in the abdomen. Passage of more gas than usual.  Walking can help get rid of the air that was put into your GI tract during the procedure and reduce the bloating. If you had a lower endoscopy (such as a colonoscopy or flexible sigmoidoscopy) you may notice spotting of blood in your stool or on the toilet paper. If you underwent a bowel prep for your procedure, you may not have a normal bowel movement for a few days.  Please Note:  You might notice some irritation and congestion in your nose or some drainage.  This is from the oxygen used during your procedure.  There is no need for concern and it should clear up in a day or so.  SYMPTOMS TO REPORT IMMEDIATELY:  Following lower endoscopy (colonoscopy or flexible sigmoidoscopy):  Excessive amounts of blood in the stool  Significant tenderness or worsening of abdominal pains  Swelling of the abdomen that is new, acute  Fever of 100F or higher   For urgent or emergent issues, a gastroenterologist can be reached at any hour by calling [phone]. Do not use MyChart messaging for urgent concerns.    DIET:  We do recommend a small meal at first, but  then you may proceed to your regular diet.  Drink plenty of fluids but you should avoid alcoholic beverages for 24 hours.  ACTIVITY:  You should plan to take it easy for the rest of today and you should NOT DRIVE or use heavy machinery until tomorrow (because of the sedation medicines used during the test).    FOLLOW UP: Our staff will call the number listed on your records the next business day following your procedure.  We will call around 7:15- 8:00 am to check on you and address any questions or concerns that you may have regarding the information given to you following your procedure. If we do not reach you, we will leave a message.     If any biopsies were taken you will be contacted by phone or by letter within the next 1-3 weeks.  Please call us at [phone] if you have not heard about the biopsies in 3 weeks.    SIGNATURES/CONFIDENTIALITY: You and/or your care partner have signed paperwork which will be entered into your electronic medical record.  These signatures attest to the fact that that the information above on your After Visit Summary has been reviewed and is understood.  Full responsibility of the confidentiality of this discharge information lies with you and/or your care-partner.

## 2022-06-14 NOTE — Progress Notes (Signed)
.  rm °

## 2022-06-15 ENCOUNTER — Telehealth: Payer: Self-pay | Admitting: *Deleted

## 2022-06-15 NOTE — Telephone Encounter (Signed)
Attempted to call patient for their post-procedure follow-up call. No answer. Left voicemail.   

## 2022-06-18 ENCOUNTER — Encounter: Payer: Self-pay | Admitting: Gastroenterology

## 2022-07-12 DIAGNOSIS — M25522 Pain in left elbow: Secondary | ICD-10-CM | POA: Diagnosis not present

## 2022-07-12 DIAGNOSIS — L573 Poikiloderma of Civatte: Secondary | ICD-10-CM | POA: Diagnosis not present

## 2022-07-12 DIAGNOSIS — M25511 Pain in right shoulder: Secondary | ICD-10-CM | POA: Diagnosis not present

## 2022-07-12 DIAGNOSIS — L719 Rosacea, unspecified: Secondary | ICD-10-CM | POA: Diagnosis not present

## 2022-08-12 ENCOUNTER — Encounter: Payer: Self-pay | Admitting: Family

## 2022-08-12 ENCOUNTER — Ambulatory Visit: Payer: BC Managed Care – PPO | Admitting: Family

## 2022-08-12 VITALS — BP 110/80 | HR 82 | Temp 97.6°F | Wt 176.8 lb

## 2022-08-12 DIAGNOSIS — F411 Generalized anxiety disorder: Secondary | ICD-10-CM | POA: Diagnosis not present

## 2022-08-12 DIAGNOSIS — F430 Acute stress reaction: Secondary | ICD-10-CM | POA: Diagnosis not present

## 2022-08-12 MED ORDER — ESCITALOPRAM OXALATE 10 MG PO TABS: 10.0000 mg | ORAL_TABLET | Freq: Every day | ORAL | 3 refills | Status: DC
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
## 2022-08-15 ENCOUNTER — Other Ambulatory Visit: Payer: Self-pay | Admitting: Family Medicine

## 2022-08-16 ENCOUNTER — Encounter: Payer: Self-pay | Admitting: Family Medicine

## 2022-08-16 NOTE — Telephone Encounter (Signed)
Yes. Please write her a note to miss work those days

## 2022-08-16 NOTE — Telephone Encounter (Signed)
Pt LOV was on 08/12/22 Last refill was done on 02/08/2022 Please advise

## 2022-08-19 NOTE — Telephone Encounter (Signed)
Please advise 

## 2022-08-19 NOTE — Telephone Encounter (Signed)
Okay, please write her a note for these extra days off

## 2022-08-20 ENCOUNTER — Encounter: Payer: Self-pay | Admitting: Family Medicine

## 2022-08-24 DIAGNOSIS — F430 Acute stress reaction: Secondary | ICD-10-CM | POA: Diagnosis not present

## 2022-08-24 DIAGNOSIS — F4325 Adjustment disorder with mixed disturbance of emotions and conduct: Secondary | ICD-10-CM | POA: Diagnosis not present

## 2022-08-24 DIAGNOSIS — F4323 Adjustment disorder with mixed anxiety and depressed mood: Secondary | ICD-10-CM | POA: Diagnosis not present

## 2022-08-27 ENCOUNTER — Other Ambulatory Visit: Payer: Self-pay

## 2022-08-27 ENCOUNTER — Telehealth: Payer: Self-pay

## 2022-08-27 NOTE — Telephone Encounter (Signed)
Pt message sent to  PCP for advise ?

## 2022-08-27 NOTE — Telephone Encounter (Signed)
Left detailed message for pt regarding this message advised to call the office with any questions

## 2022-08-30 ENCOUNTER — Encounter: Payer: Self-pay | Admitting: Family Medicine

## 2022-08-30 ENCOUNTER — Ambulatory Visit: Payer: BC Managed Care – PPO | Admitting: Family Medicine

## 2022-08-30 VITALS — BP 108/68 | HR 73 | Temp 97.9°F | Wt 176.8 lb

## 2022-08-30 DIAGNOSIS — F418 Other specified anxiety disorders: Secondary | ICD-10-CM

## 2022-08-30 NOTE — Telephone Encounter (Signed)
Pt had an office visit this morning with Dr Clent Ridges regarding this message, nothing further needed
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
## 2022-08-30 NOTE — Telephone Encounter (Signed)
We took care of this at her OV today

## 2022-09-02 ENCOUNTER — Other Ambulatory Visit: Payer: Self-pay

## 2022-09-02 ENCOUNTER — Ambulatory Visit: Payer: BC Managed Care – PPO | Admitting: Family Medicine

## 2022-09-02 ENCOUNTER — Encounter: Payer: Self-pay | Admitting: Family Medicine

## 2022-09-03 NOTE — Telephone Encounter (Signed)
Please write her such a note  

## 2022-09-08 ENCOUNTER — Encounter: Payer: Self-pay | Admitting: Family Medicine

## 2022-09-08 ENCOUNTER — Ambulatory Visit: Payer: BC Managed Care – PPO | Admitting: Family Medicine

## 2022-09-08 VITALS — BP 110/68 | HR 76 | Temp 98.3°F | Wt 172.0 lb

## 2022-09-08 DIAGNOSIS — F418 Other specified anxiety disorders: Secondary | ICD-10-CM

## 2022-09-08 MED ORDER — ESCITALOPRAM OXALATE 20 MG PO TABS: 20.0000 mg | ORAL_TABLET | Freq: Every day | ORAL | 2 refills | Status: DC

## 2022-09-08 NOTE — Progress Notes (Signed)
   Subjective:    Patient ID: [name] [name], female    DOB: 1971-01-29, 52 y.o.   MRN: [id number]  HPI Here to follow up on anxiety and depression. For the past 4 weeks she has been taking Lexapro 10 mg one a day and Lorazepam 2-3 times a day. She feels better, but she asks about increasing the dose. She will have her first therapy session with her therpaist, Carloyn Jaeger, tomorrow.    Review of Systems  Constitutional: Negative.   Respiratory: Negative.    Cardiovascular: Negative.   Psychiatric/Behavioral:  Positive for decreased concentration and dysphoric mood. Negative for agitation and sleep disturbance. The patient is nervous/anxious.        Objective:   Physical Exam Constitutional:      Appearance: Normal appearance.  Cardiovascular:     Rate and Rhythm: Normal rate and regular rhythm.     Pulses: Normal pulses.     Heart sounds: Normal heart sounds.  Pulmonary:     Effort: Pulmonary effort is normal.     Breath sounds: Normal breath sounds.  Neurological:     Mental Status: She is alert.  Psychiatric:        Mood and Affect: Mood normal.        Behavior: Behavior normal.        Thought Content: Thought content normal.           Assessment & Plan:  Her anxiety and depression have improved. We will increase the Lexapro to 20 mg daily. Recheck in 4 weeks.  Gershon Crane, MD

## 2022-09-16 DIAGNOSIS — F430 Acute stress reaction: Secondary | ICD-10-CM | POA: Diagnosis not present

## 2022-09-16 DIAGNOSIS — F4323 Adjustment disorder with mixed anxiety and depressed mood: Secondary | ICD-10-CM | POA: Diagnosis not present

## 2022-09-16 DIAGNOSIS — F4325 Adjustment disorder with mixed disturbance of emotions and conduct: Secondary | ICD-10-CM | POA: Diagnosis not present

## 2022-09-17 ENCOUNTER — Telehealth: Payer: Self-pay | Admitting: Family Medicine

## 2022-09-17 NOTE — Telephone Encounter (Signed)
Chris with [employer] will fax over attending physician statement form

## 2022-09-19 ENCOUNTER — Other Ambulatory Visit: Payer: Self-pay | Admitting: Family Medicine

## 2022-09-20 NOTE — Telephone Encounter (Signed)
Left pt a message advised to call the office back regarding her FMLA form

## 2022-09-20 NOTE — Telephone Encounter (Signed)
Pt form was received, Dr Clent Ridges awaiting more information on pt 1st day out of work.

## 2022-09-21 ENCOUNTER — Encounter: Payer: Self-pay | Admitting: Family Medicine

## 2022-09-21 ENCOUNTER — Ambulatory Visit: Payer: BC Managed Care – PPO | Admitting: Family Medicine

## 2022-09-21 VITALS — BP 110/78 | HR 70 | Temp 98.2°F | Wt 172.6 lb

## 2022-09-21 DIAGNOSIS — F418 Other specified anxiety disorders: Secondary | ICD-10-CM | POA: Diagnosis not present

## 2022-09-21 MED ORDER — FLUOXETINE HCL 20 MG PO CAPS: 20.0000 mg | ORAL_CAPSULE | Freq: Every day | ORAL | 3 refills | Status: DC

## 2022-09-21 NOTE — Telephone Encounter (Signed)
Pt had office appointment with Dr Clent Ridges this morning. Dr Clent Ridges completed the FMLA form at the visit with pt

## 2022-09-21 NOTE — Progress Notes (Signed)
   Subjective:    Patient ID: [name] [name], female    DOB: 1970-05-09, 52 y.o.   MRN: [id number]  HPI Here to follow up on anxiety and depression. She has been taking Lexapro 20 mg daily, and this has helped her moods tremendously. However it has also made her very tired and sleepy, and this has prevented her from working. She has begun sessoins with her therapist.    Review of Systems  Constitutional:  Positive for fatigue.  Respiratory: Negative.    Cardiovascular: Negative.   Psychiatric/Behavioral:  Positive for decreased concentration and dysphoric mood. The patient is nervous/anxious.        Objective:   Physical Exam Constitutional:      Appearance: Normal appearance.  Cardiovascular:     Rate and Rhythm: Normal rate and regular rhythm.     Pulses: Normal pulses.     Heart sounds: Normal heart sounds.  Pulmonary:     Effort: Pulmonary effort is normal.     Breath sounds: Normal breath sounds.  Neurological:     Mental Status: She is alert.  Psychiatric:        Mood and Affect: Mood normal.        Behavior: Behavior normal.        Thought Content: Thought content normal.     Comments: Her affect is much improved            Assessment & Plan:  Her anxiety and depression are getting better, but the Lexapro is causing sedation. Therefore we will stop this and substitute Prozac 20 mg daily. She will report back in 2 weeks. We also extended her leave from work until 09-27-22.  Gershon Crane, MD

## 2022-09-23 ENCOUNTER — Telehealth: Payer: Self-pay | Admitting: Family Medicine

## 2022-09-23 DIAGNOSIS — F4323 Adjustment disorder with mixed anxiety and depressed mood: Secondary | ICD-10-CM | POA: Diagnosis not present

## 2022-09-23 DIAGNOSIS — F4325 Adjustment disorder with mixed disturbance of emotions and conduct: Secondary | ICD-10-CM | POA: Diagnosis not present

## 2022-09-23 DIAGNOSIS — F430 Acute stress reaction: Secondary | ICD-10-CM | POA: Diagnosis not present

## 2022-09-23 NOTE — Telephone Encounter (Signed)
[name] [name] from [employer] called wanting to check on the status of a fax that was sent yesterday for pt. He states it was a medical request letter for Dr. Clent Ridges to fill out from [place]. He is requesting letter to be filled out and faxed back by 09/27/22. Call back number with questions is: [phone], contact name is Marylene Land.

## 2022-09-24 NOTE — Telephone Encounter (Signed)
Left a detailed message for [name] [name], advised that pt FMLA form was refax today, a confirmation was received

## 2022-09-26 ENCOUNTER — Encounter: Payer: Self-pay | Admitting: Family Medicine

## 2022-09-27 ENCOUNTER — Other Ambulatory Visit (HOSPITAL_COMMUNITY): Payer: Self-pay

## 2022-09-30 DIAGNOSIS — F430 Acute stress reaction: Secondary | ICD-10-CM | POA: Diagnosis not present

## 2022-09-30 DIAGNOSIS — F4323 Adjustment disorder with mixed anxiety and depressed mood: Secondary | ICD-10-CM | POA: Diagnosis not present

## 2022-10-04 ENCOUNTER — Encounter: Payer: Self-pay | Admitting: Family Medicine

## 2022-10-05 NOTE — Telephone Encounter (Signed)
Pt came by the office this afternoon picked up both FMLA  Forms copies sent to scanning

## 2022-10-06 NOTE — Telephone Encounter (Signed)
Pt picked up forms on 10/05/2022 copied sent to scanning

## 2022-10-07 DIAGNOSIS — F430 Acute stress reaction: Secondary | ICD-10-CM | POA: Diagnosis not present

## 2022-10-07 DIAGNOSIS — F4323 Adjustment disorder with mixed anxiety and depressed mood: Secondary | ICD-10-CM | POA: Diagnosis not present

## 2022-10-07 DIAGNOSIS — F4325 Adjustment disorder with mixed disturbance of emotions and conduct: Secondary | ICD-10-CM | POA: Diagnosis not present

## 2022-10-14 DIAGNOSIS — F430 Acute stress reaction: Secondary | ICD-10-CM | POA: Diagnosis not present

## 2022-10-14 DIAGNOSIS — F4323 Adjustment disorder with mixed anxiety and depressed mood: Secondary | ICD-10-CM | POA: Diagnosis not present

## 2022-10-14 DIAGNOSIS — F4325 Adjustment disorder with mixed disturbance of emotions and conduct: Secondary | ICD-10-CM | POA: Diagnosis not present

## 2022-10-18 ENCOUNTER — Encounter: Payer: Self-pay | Admitting: Family Medicine

## 2022-10-18 NOTE — Telephone Encounter (Signed)
We will increase the Prozac to 40 mg daily. Please call in #[phone] with 5 rf

## 2022-10-19 MED ORDER — FLUOXETINE HCL 40 MG PO CAPS: 40.0000 mg | ORAL_CAPSULE | Freq: Every day | ORAL | 5 refills | Status: DC

## 2022-10-21 DIAGNOSIS — F4323 Adjustment disorder with mixed anxiety and depressed mood: Secondary | ICD-10-CM | POA: Diagnosis not present

## 2022-10-21 DIAGNOSIS — F430 Acute stress reaction: Secondary | ICD-10-CM | POA: Diagnosis not present

## 2022-10-24 DIAGNOSIS — K0490 Unspecified diseases of pulp and periapical tissues: Secondary | ICD-10-CM | POA: Diagnosis not present

## 2022-10-28 DIAGNOSIS — F4323 Adjustment disorder with mixed anxiety and depressed mood: Secondary | ICD-10-CM | POA: Diagnosis not present

## 2022-10-28 DIAGNOSIS — F430 Acute stress reaction: Secondary | ICD-10-CM | POA: Diagnosis not present

## 2022-11-04 DIAGNOSIS — F430 Acute stress reaction: Secondary | ICD-10-CM | POA: Diagnosis not present

## 2022-11-04 DIAGNOSIS — F4325 Adjustment disorder with mixed disturbance of emotions and conduct: Secondary | ICD-10-CM | POA: Diagnosis not present

## 2022-11-08 DIAGNOSIS — J018 Other acute sinusitis: Secondary | ICD-10-CM | POA: Diagnosis not present

## 2022-11-14 ENCOUNTER — Encounter: Payer: Self-pay | Admitting: Family Medicine

## 2022-11-16 DIAGNOSIS — F430 Acute stress reaction: Secondary | ICD-10-CM | POA: Diagnosis not present

## 2022-11-16 DIAGNOSIS — F4323 Adjustment disorder with mixed anxiety and depressed mood: Secondary | ICD-10-CM | POA: Diagnosis not present

## 2022-11-23 ENCOUNTER — Other Ambulatory Visit: Payer: Self-pay

## 2022-11-23 MED ORDER — FLUOXETINE HCL 40 MG PO CAPS: 40.0000 mg | ORAL_CAPSULE | Freq: Two times a day (BID) | ORAL | 5 refills | Status: DC

## 2022-11-23 NOTE — Telephone Encounter (Signed)
Increase the Prozac 40 mg to BID. Call in #[phone] with 5 rf

## 2022-11-26 DIAGNOSIS — F430 Acute stress reaction: Secondary | ICD-10-CM | POA: Diagnosis not present

## 2022-11-26 DIAGNOSIS — F4323 Adjustment disorder with mixed anxiety and depressed mood: Secondary | ICD-10-CM | POA: Diagnosis not present

## 2022-12-02 DIAGNOSIS — F430 Acute stress reaction: Secondary | ICD-10-CM | POA: Diagnosis not present

## 2022-12-02 DIAGNOSIS — F4323 Adjustment disorder with mixed anxiety and depressed mood: Secondary | ICD-10-CM | POA: Diagnosis not present

## 2022-12-08 DIAGNOSIS — R197 Diarrhea, unspecified: Secondary | ICD-10-CM | POA: Diagnosis not present

## 2023-04-11 ENCOUNTER — Other Ambulatory Visit: Payer: Self-pay | Admitting: Family Medicine

## 2023-04-18 ENCOUNTER — Encounter: Payer: Self-pay | Admitting: Family Medicine

## 2023-04-18 ENCOUNTER — Other Ambulatory Visit: Payer: Self-pay | Admitting: Family Medicine

## 2023-04-18 DIAGNOSIS — J45909 Unspecified asthma, uncomplicated: Secondary | ICD-10-CM

## 2023-04-18 MED ORDER — VENTOLIN HFA 108 (90 BASE) MCG/ACT IN AERS: 2.0000 | INHALATION_SPRAY | RESPIRATORY_TRACT | 5 refills | Status: DC | PRN

## 2023-04-18 NOTE — Telephone Encounter (Signed)
Done

## 2023-05-19 ENCOUNTER — Other Ambulatory Visit: Payer: Self-pay | Admitting: Family Medicine

## 2023-06-30 ENCOUNTER — Other Ambulatory Visit: Payer: Self-pay | Admitting: Family Medicine

## 2023-08-02 ENCOUNTER — Other Ambulatory Visit: Payer: Self-pay | Admitting: Family Medicine

## 2023-08-31 ENCOUNTER — Other Ambulatory Visit: Payer: Self-pay | Admitting: Family Medicine

## 2023-10-08 ENCOUNTER — Other Ambulatory Visit: Payer: Self-pay | Admitting: Family Medicine

## 2023-10-17 ENCOUNTER — Other Ambulatory Visit: Payer: Self-pay | Admitting: Family Medicine

## 2023-10-18 ENCOUNTER — Other Ambulatory Visit: Payer: Self-pay | Admitting: Family Medicine

## 2023-12-13 ENCOUNTER — Other Ambulatory Visit: Payer: Self-pay | Admitting: Family Medicine

## 2024-02-11 ENCOUNTER — Other Ambulatory Visit: Payer: Self-pay | Admitting: Family Medicine

## 2024-03-06 ENCOUNTER — Other Ambulatory Visit: Payer: Self-pay | Admitting: Family Medicine

## 2024-05-01 ENCOUNTER — Other Ambulatory Visit: Payer: Self-pay | Admitting: Family Medicine
